# Patient Record
Sex: Male | Born: 1975 | Race: Black or African American | Hispanic: No | Marital: Single | Smoking: Current every day smoker
Health system: Southern US, Community
[De-identification: ages and names within clinical notes are randomized; demographics above are authoritative.]

## PROBLEM LIST (undated history)

## (undated) HISTORY — PX: ROTATOR CUFF REPAIR: SHX139

## (undated) HISTORY — PX: MENISCUS REPAIR: SHX5179

---

## 1998-11-21 ENCOUNTER — Emergency Department (HOSPITAL_COMMUNITY): Admission: EM | Admit: 1998-11-21 | Discharge: 1998-11-22 | Payer: Self-pay | Admitting: Emergency Medicine

## 2020-12-29 ENCOUNTER — Ambulatory Visit (HOSPITAL_COMMUNITY)
Admission: EM | Admit: 2020-12-29 | Discharge: 2020-12-29 | Disposition: A | Discharge status: Home / Self Care | Payer: Self-pay | Attending: Student | Admitting: Student

## 2020-12-29 ENCOUNTER — Ambulatory Visit (INDEPENDENT_AMBULATORY_CARE_PROVIDER_SITE_OTHER): Payer: Self-pay

## 2020-12-29 ENCOUNTER — Other Ambulatory Visit: Payer: Self-pay

## 2020-12-29 ENCOUNTER — Encounter (HOSPITAL_COMMUNITY): Payer: Self-pay | Admitting: *Deleted

## 2020-12-29 DIAGNOSIS — M62838 Other muscle spasm: Secondary | ICD-10-CM | POA: Insufficient documentation

## 2020-12-29 DIAGNOSIS — M542 Cervicalgia: Secondary | ICD-10-CM | POA: Insufficient documentation

## 2020-12-29 DIAGNOSIS — Z113 Encounter for screening for infections with a predominantly sexual mode of transmission: Secondary | ICD-10-CM | POA: Insufficient documentation

## 2020-12-29 DIAGNOSIS — Z202 Contact with and (suspected) exposure to infections with a predominantly sexual mode of transmission: Secondary | ICD-10-CM | POA: Insufficient documentation

## 2020-12-29 LAB — HIV ANTIBODY (ROUTINE TESTING W REFLEX): HIV Screen 4th Generation wRfx: NONREACTIVE

## 2020-12-29 LAB — HEPATITIS PANEL, ACUTE
HCV Ab: NONREACTIVE
Hep A IgM: NONREACTIVE
Hep B C IgM: NONREACTIVE
Hepatitis B Surface Ag: NONREACTIVE

## 2020-12-29 MED ORDER — NAPROXEN 500 MG PO TABS: 500.0000 mg | ORAL_TABLET | Freq: Two times a day (BID) | ORAL | 0 refills | Status: DC

## 2020-12-29 MED ORDER — TIZANIDINE HCL 4 MG PO CAPS: 4.0000 mg | ORAL_CAPSULE | Freq: Three times a day (TID) | ORAL | 0 refills | Status: DC

## 2020-12-29 NOTE — Discharge Instructions (Signed)
Your x-ray was normal.  I have called in Naprosyn which is like ibuprofen to help with your symptoms.  Take this twice a day as needed.  You should not take additional NSAIDs including aspirin, ibuprofen/Advil, naproxen/Aleve with this medication as it can cause stomach bleeding.  I have also called in a muscle relaxer known as tizanidine.  This can make you sleepy so do not drive or drink alcohol while taking this.  You can add 3 take 3 times a day but if you have things to do that day I recommend only taking it at night.  Use heat and stretch for additional symptom relief.  If your symptoms not improve you may need to consider MRI which need to be arranged with your PCP.  We will be in touch with your STI testing results soon as we have them if we need to arrange treatment.  If you develop any symptoms please return for reevaluation.

## 2020-12-29 NOTE — ED Notes (Signed)
No answer  when called in lobby 
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
## 2020-12-30 LAB — RPR: RPR Ser Ql: NONREACTIVE

## 2020-12-31 LAB — CYTOLOGY, (ORAL, ANAL, URETHRAL) ANCILLARY ONLY
Chlamydia: NEGATIVE
Comment: NEGATIVE
Comment: NEGATIVE
Comment: NORMAL
Neisseria Gonorrhea: NEGATIVE
Trichomonas: POSITIVE — AB

## 2021-01-01 ENCOUNTER — Emergency Department (HOSPITAL_COMMUNITY)
Admission: EM | Admit: 2021-01-01 | Discharge: 2021-01-01 | Disposition: A | Discharge status: Home / Self Care | Payer: Self-pay | Attending: Emergency Medicine | Admitting: Emergency Medicine

## 2021-01-01 ENCOUNTER — Telehealth (HOSPITAL_COMMUNITY): Payer: Self-pay | Admitting: Emergency Medicine

## 2021-01-01 DIAGNOSIS — M542 Cervicalgia: Secondary | ICD-10-CM | POA: Insufficient documentation

## 2021-01-01 DIAGNOSIS — F17200 Nicotine dependence, unspecified, uncomplicated: Secondary | ICD-10-CM | POA: Insufficient documentation

## 2021-01-01 MED ORDER — HYDROMORPHONE HCL 1 MG/ML IJ SOLN
1.0000 mg | Freq: Once | INTRAMUSCULAR | Status: AC
  Administered 2021-01-01: 1 mg via INTRAMUSCULAR
  Filled 2021-01-01: qty 1

## 2021-01-01 MED ORDER — PREDNISONE 20 MG PO TABS: 40.0000 mg | ORAL_TABLET | Freq: Every day | ORAL | 0 refills | Status: AC

## 2021-01-01 MED ORDER — METRONIDAZOLE 500 MG PO TABS: 500.0000 mg | ORAL_TABLET | Freq: Two times a day (BID) | ORAL | 0 refills | Status: DC

## 2021-01-01 MED ORDER — IBUPROFEN 800 MG PO TABS
800.0000 mg | ORAL_TABLET | Freq: Once | ORAL | Status: AC
  Administered 2021-01-01: 800 mg via ORAL
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
## 2021-01-01 NOTE — Discharge Instructions (Signed)
You have been seen here for neck pain, I will give you a prescription for steroids please take as prescribed, please beware that  this medication can increase your blood sugar, increase your blood pressure, increase your heart rate, patient all resolved 5 days after your last dose.  I recommend taking over-the-counter pain medications like ibuprofen and/or Tylenol every 6 as needed.  Please follow dosage and on the back of bottle.  I also recommend applying heat to the area and stretching out the muscles as this will help decrease stiffness and pain.  I have given you information on exercises please follow.  Please follow-up with community health and wellness for reevaluation given contact information above please call  Come back to the emergency department if you develop chest pain, shortness of breath, severe abdominal pain, uncontrolled nausea, vomiting, diarrhea.

## 2021-03-06 ENCOUNTER — Encounter (HOSPITAL_COMMUNITY): Payer: Self-pay | Admitting: Emergency Medicine

## 2021-03-06 ENCOUNTER — Other Ambulatory Visit: Payer: Self-pay

## 2021-03-06 ENCOUNTER — Ambulatory Visit (HOSPITAL_COMMUNITY)
Admission: EM | Admit: 2021-03-06 | Discharge: 2021-03-06 | Disposition: A | Discharge status: Home / Self Care | Payer: Self-pay

## 2021-03-06 ENCOUNTER — Emergency Department (HOSPITAL_COMMUNITY)
Admission: EM | Admit: 2021-03-06 | Discharge: 2021-03-06 | Disposition: A | Discharge status: Home / Self Care | Payer: Self-pay | Attending: Emergency Medicine | Admitting: Emergency Medicine

## 2021-03-06 ENCOUNTER — Encounter (HOSPITAL_COMMUNITY): Payer: Self-pay

## 2021-03-06 ENCOUNTER — Emergency Department (HOSPITAL_COMMUNITY): Payer: Self-pay

## 2021-03-06 DIAGNOSIS — M542 Cervicalgia: Secondary | ICD-10-CM | POA: Insufficient documentation

## 2021-03-06 DIAGNOSIS — F17200 Nicotine dependence, unspecified, uncomplicated: Secondary | ICD-10-CM | POA: Insufficient documentation

## 2021-03-06 DIAGNOSIS — Z202 Contact with and (suspected) exposure to infections with a predominantly sexual mode of transmission: Secondary | ICD-10-CM | POA: Insufficient documentation

## 2021-03-06 LAB — HIV ANTIBODY (ROUTINE TESTING W REFLEX): HIV Screen 4th Generation wRfx: NONREACTIVE

## 2021-03-06 MED ORDER — LIDOCAINE 5 % EX PTCH
1.0000 | MEDICATED_PATCH | CUTANEOUS | Status: DC
  Administered 2021-03-06: 1 via TRANSDERMAL
  Filled 2021-03-06: qty 1

## 2021-03-06 MED ORDER — METRONIDAZOLE 500 MG PO TABS
2000.0000 mg | ORAL_TABLET | Freq: Once | ORAL | Status: AC
  Administered 2021-03-06: 2000 mg via ORAL
  Filled 2021-03-06: qty 4

## 2021-03-06 MED ORDER — NAPROXEN 500 MG PO TABS: 500.0000 mg | ORAL_TABLET | Freq: Two times a day (BID) | ORAL | 0 refills | Status: DC

## 2021-03-06 MED ORDER — METHOCARBAMOL 500 MG PO TABS: 500.0000 mg | ORAL_TABLET | Freq: Two times a day (BID) | ORAL | 0 refills | Status: DC

## 2021-03-06 NOTE — Discharge Instructions (Addendum)
It was a pleasure taking care of you today. As discussed, your x-ray showed degenerative changes, but no broken bones. I am sending you home with symptomatic treatment.  Take as needed for pain.  Muscle relaxer can cause drowsiness so do not drive or operate machinery while on the medication.  You may also purchase over-the-counter Lidoderm patches and Voltaren gel for added pain relief.  Your STD test are pending.  Results should be available in the next few days. I have included the number of the neurosurgeon. Call to schedule an appointment for further evaluation of neck pain. Return to the ER for new or worsening symptoms.

## 2021-03-06 NOTE — ED Provider Notes (Signed)
[facility] EMERGENCY DEPARTMENT Provider Note   CSN: [id number] Arrival date & time: 03/06/21  1030     History Chief Complaint  Patient presents with   Exposure to STD   Neck Pain    Bill Greene is a 45 y.o. male with no significant past medical history who presents to the ED due to intermittent right-sided neck pain x2 months. No known injury. Admits to intermittent numbness/tingling around right side of neck.  Denies numbness/tingling to right upper extremity.  No upper extremity weakness.  He has been taking ibuprofen and using icy hot with moderate relief.  Patient was evaluated at urgent care prior to arrival and discussed with the provider that he was concerned about his artery, so sent to the ED for further evaluation. No headaches or blurry vision. No chest pain or shortness of breath.  Chart reviewed.  Patient was seen back in May 2022 for the same complaint which was thought to be related to MSK etiology.  Patient notes he never tried his muscle relaxers or pain medication.  Patient had an x-ray then which was negative for any acute abnormalities.  Patient states pain is worse when moving his neck.  No neck stiffness.  Denies fever and chills.  Patient is also requesting STD testing.  Denies penile discharge, testicular pain/edema, rash, dysuria, abdominal pain, pain with defection. He notes his partner tested positive for Trichomonas. No fever or chills.  History obtained from patient and past medical records. No interpreter used during encounter.       History reviewed. No pertinent past medical history.  There are no problems to display for this patient.   History reviewed. No pertinent surgical history.     Family History  Problem Relation Age of Onset   Healthy Mother     Social History   Tobacco Use   Smoking status: Every Day   Smokeless tobacco: Never  Substance Use Topics   Alcohol use: Not Currently   Drug use: Never    Home  Medications Prior to Admission medications   Medication Sig Start Date End Date Taking? Authorizing Provider  methocarbamol (ROBAXIN) 500 MG tablet Take 1 tablet (500 mg total) by mouth 2 (two) times daily. 03/06/21  Yes Aryn Safran C, PA-C  naproxen (NAPROSYN) 500 MG tablet Take 1 tablet (500 mg total) by mouth 2 (two) times daily. 03/06/21  Yes Jayr Lupercio, Merla Riches, PA-C  metroNIDAZOLE (FLAGYL) 500 MG tablet Take 1 tablet (500 mg total) by mouth 2 (two) times daily. 01/01/21   Lamptey, Britta Mccreedy, MD  naproxen (NAPROSYN) 500 MG tablet Take 1 tablet (500 mg total) by mouth 2 (two) times daily. 12/29/20   Raspet, Noberto Retort, PA-C  tiZANidine (ZANAFLEX) 4 MG capsule Take 1 capsule (4 mg total) by mouth 3 (three) times daily. 12/29/20   Raspet, Noberto Retort, PA-C    Allergies    Patient has no known allergies.  Review of Systems   Review of Systems  Constitutional:  Negative for chills and fever.  Respiratory:  Negative for shortness of breath.   Cardiovascular:  Negative for chest pain.  Gastrointestinal:  Negative for abdominal pain.  Genitourinary:  Negative for dysuria, hematuria, penile discharge, penile pain and testicular pain.  Musculoskeletal:  Positive for neck pain. Negative for neck stiffness.  Neurological:  Positive for numbness. Negative for weakness.  All other systems reviewed and are negative.  Physical Exam Updated Vital Signs BP (!) 144/81 (BP Location: Left Arm)  Pulse 84   Temp 98.7 F (37.1 C) (Oral)   Resp 16   SpO2 96%   Physical Exam Vitals and nursing note reviewed.  Constitutional:      General: He is not in acute distress.    Appearance: He is not ill-appearing.  HENT:     Head: Normocephalic.  Eyes:     Pupils: Pupils are equal, round, and reactive to light.  Neck:     Comments: No cervical midline tenderness.  Full range of motion of neck.  Reproducible right-sided trapezius muscle tenderness.  Cardiovascular:     Rate and Rhythm: Normal rate and regular  rhythm.     Pulses: Normal pulses.     Heart sounds: Normal heart sounds. No murmur heard.   No friction rub. No gallop.  Pulmonary:     Effort: Pulmonary effort is normal.     Breath sounds: Normal breath sounds.  Abdominal:     General: Abdomen is flat. There is no distension.     Palpations: Abdomen is soft.     Tenderness: There is no abdominal tenderness. There is no guarding or rebound.  Musculoskeletal:        General: Normal range of motion.     Cervical back: Neck supple.     Comments: Equal grip strength.  No thoracic or lumbar midline tenderness.  Skin:    General: Skin is warm and dry.  Neurological:     General: No focal deficit present.     Mental Status: He is alert.  Psychiatric:        Mood and Affect: Mood normal.        Behavior: Behavior normal.    ED Results / Procedures / Treatments   Labs (all labs ordered are listed, but only abnormal results are displayed) Labs Reviewed  HIV ANTIBODY (ROUTINE TESTING W REFLEX)  RPR  GC/CHLAMYDIA PROBE AMP ([place]) NOT AT [facility]    EKG None  Radiology DG Cervical Spine Complete  Result Date: 03/06/2021 CLINICAL DATA:  Neck pain radiating to right side of head. EXAM: CERVICAL SPINE - COMPLETE 4+ VIEW COMPARISON:  12/29/2020 FINDINGS: Normal alignment. Diffuse degenerative disc disease with disc space narrowing and spurring. Diffuse bilateral degenerative facet disease. Bilateral neural foraminal narrowing from C4-5 through C6-7 due to uncovertebral spurring and facet disease. No fracture. IMPRESSION: Degenerative disc and facet disease as above. No acute bony abnormality. Electronically Signed   By: Charlett Nose M.D.   On: 03/06/2021 11:44    Procedures Procedures   Medications Ordered in ED Medications  lidocaine (LIDODERM) 5 % 1 patch (has no administration in time range)  metroNIDAZOLE (FLAGYL) tablet 2,000 mg (has no administration in time range)    ED Course  I have reviewed the triage vital signs and  the nursing notes.  Pertinent labs & imaging results that were available during my care of the patient were reviewed by me and considered in my medical decision making (see chart for details).    MDM Rules/Calculators/A&P                         {Remember to document critical care time when appropriate: 45 year old male presents to the ED due to right-sided neck pain x2 months.  He is also requesting STD testing.  Had a recent trichomonas exposure.  No penile symptoms.  No fever or chills. No neck injury.  Upon arrival, stable vitals.  Patient is afebrile, not tachycardic or hypoxic.  Patient nontoxic-appearing.  Physical exam reassuring.  No cervical, thoracic, or lumbar midline tenderness.  Equal grip strength.  Upper extremities neurovascularly intact.  Low suspicion for central cord compression.  Reproducible tenderness over right trapezius muscle. Suspect neck pain related to muscular etiology. C-spine x-ray negative for any bony fractures.  It did demonstrate degenerative changes. STD testing pending. Patient treated for trichomonas due to exposure here in the ED. Neurosurgery number given to patient at discharge and advised to call to schedule an appointment for further evaluation of neck due to degenerative changes on x-ray. Patient discharged with symptomatic treatment. Strict ED precautions discussed with patient. Patient states understanding and agrees to plan. Patient discharged home in no acute distress and stable vitals  Final Clinical Impression(s) / ED Diagnoses Final diagnoses:  Exposure to STD  Neck pain    Rx / DC Orders ED Discharge Orders          Ordered    methocarbamol (ROBAXIN) 500 MG tablet  2 times daily        03/06/21 1529    naproxen (NAPROSYN) 500 MG tablet  2 times daily        03/06/21 1529             Jesusita Oka 03/06/21 1533    Long, Arlyss Repress, MD 03/11/21 1303

## 2021-03-06 NOTE — ED Triage Notes (Signed)
Pt requests STD testing, denies any symptoms. Also having right sided neck pain and popping. Denies CP.

## 2021-03-06 NOTE — ED Notes (Signed)
Pt stepped outside.  

## 2021-03-06 NOTE — ED Notes (Signed)
Patient is being discharged from the Urgent Care and sent to the Emergency Department via pov . Per Chales Salmon, NP, patient is in need of higher level of care due to Severe neck pain. Patient states that his "artery is blocked" Patient is aware and verbalizes understanding of plan of care.  Vitals:   03/06/21 1006  BP: 140/82  Pulse: 98  Resp: 20  Temp: 98.2 F (36.8 C)  SpO2: 97%

## 2021-03-06 NOTE — ED Triage Notes (Signed)
Pt in with c/o right side neck pain that has been going on for a while. States his neck is constantly popping and it feels like a burning sensation.  Pt states "it feels like the blood is not flowing through my artery"   Pt also requesting std testing  States he was told by someone that it could possibly be trichomonas, but then the person tested negative for trich.  Pt denies having any penile sx at the moment.

## 2021-03-06 NOTE — ED Provider Notes (Signed)
Emergency Medicine Provider Triage Evaluation Note  Bill Greene , a 45 y.o. male  was evaluated in triage.  Pt complains of neck pain and concern for STD.  Recently admitted with reports of productive department for follow-up.  He is not having any symptoms such as penile discharge, fevers, rashes.  No dysuria or hematuria.  Concerned about his neck, this has been constant, localized with movement.  Present for the last 2 months he denies any trauma..  Review of Systems  Positive: NECK PAIN, STD Negative: Above  Physical Exam  BP (!) 144/81 (BP Location: Left Arm)   Pulse 84   Temp 98.7 F (37.1 C) (Oral)   Resp 16   SpO2 96%  Gen:   Awake, no distress   Resp:  Normal effort  MSK:   Moves extremities without difficulty  Other:  No midline tenderness.  ROM ntact  Medical Decision Making  Medically screening exam initiated at 10:58 AM.  Appropriate orders placed.  [name] was informed that the remainder of the evaluation will be completed by another provider, this initial triage assessment does not replace that evaluation, and the importance of remaining in the ED until their evaluation is complete.     [name], PA-C 03/06/21 1059    [name], MD 03/07/21 1740

## 2021-03-06 NOTE — Progress Notes (Signed)
   03/06/21 1656  Clinical Encounter Type  Visited With Patient not available  Visit Type Trauma  Referral From Nurse  Consult/Referral To Chaplain    Chaplain remains available if needed. This note was prepared by Deneen Harts, M.Div..  For questions please contact by phone [phone].

## 2021-03-06 NOTE — ED Provider Notes (Signed)
[facility]    CSN: [id number] Arrival date & time: 03/06/21  2706      History   Chief Complaint Chief Complaint  Patient presents with   Neck Pain   std testing    HPI Bill Greene is a 45 y.o. male.   Patient here for evaluation of right sided neck pain that has been worsening over the past month.  Patient was seen and evaluated for the same in May.  At that time he was told it was likely muscular in nature and given muscle relaxers and steroids.  Reports not taking medications because he felt like it would improve.  Reports that he feels like "his artery is blocked" and that he thinks it is related to "his heart."  Reports taking ibuprofen which helps improve symptoms temporarily.  Also concerned about possible trich exposure.   The history is provided by the patient.  Neck Pain  History reviewed. No pertinent past medical history.  There are no problems to display for this patient.   History reviewed. No pertinent surgical history.     Home Medications    Prior to Admission medications   Medication Sig Start Date End Date Taking? Authorizing Provider  metroNIDAZOLE (FLAGYL) 500 MG tablet Take 1 tablet (500 mg total) by mouth 2 (two) times daily. 01/01/21   Lamptey, Britta Mccreedy, MD  naproxen (NAPROSYN) 500 MG tablet Take 1 tablet (500 mg total) by mouth 2 (two) times daily. 12/29/20   Raspet, Noberto Retort, PA-C  tiZANidine (ZANAFLEX) 4 MG capsule Take 1 capsule (4 mg total) by mouth 3 (three) times daily. 12/29/20   Raspet, Noberto Retort, PA-C    Family History Family History  Problem Relation Age of Onset   Healthy Mother     Social History Social History   Tobacco Use   Smoking status: Every Day   Smokeless tobacco: Never  Substance Use Topics   Alcohol use: Not Currently   Drug use: Never     Allergies   Patient has no known allergies.   Review of Systems Review of Systems  Genitourinary:  Negative for penile discharge, penile pain, penile swelling  and testicular pain.  Musculoskeletal:  Positive for neck pain.  All other systems reviewed and are negative.   Physical Exam Triage Vital Signs ED Triage Vitals  Enc Vitals Group     BP 03/06/21 1006 140/82     Pulse Rate 03/06/21 1006 98     Resp 03/06/21 1006 20     Temp 03/06/21 1006 98.2 F (36.8 C)     Temp Source 03/06/21 1006 Oral     SpO2 03/06/21 1006 97 %     Weight --      Height --      Head Circumference --      Peak Flow --      Pain Score 03/06/21 1007 6     Pain Loc --      Pain Edu? --      Excl. in GC? --    No data found.  Updated Vital Signs BP 140/82 (BP Location: Left Arm)   Pulse 98   Temp 98.2 F (36.8 C) (Oral)   Resp 20   SpO2 97%   Visual Acuity Right Eye Distance:   Left Eye Distance:   Bilateral Distance:    Right Eye Near:   Left Eye Near:    Bilateral Near:     Physical Exam Vitals and nursing note reviewed.  Constitutional:      General: He is not in acute distress.    Appearance: Normal appearance. He is not ill-appearing, toxic-appearing or diaphoretic.  HENT:     Head: Normocephalic and atraumatic.  Eyes:     Conjunctiva/sclera: Conjunctivae normal.  Cardiovascular:     Rate and Rhythm: Normal rate.     Pulses: Normal pulses.  Pulmonary:     Effort: Pulmonary effort is normal.  Abdominal:     General: Abdomen is flat.  Musculoskeletal:        General: Normal range of motion.     Left shoulder: Normal range of motion.     Cervical back: Normal range of motion. No bony tenderness.     Thoracic back: Normal.     Lumbar back: Normal.  Skin:    General: Skin is warm and dry.  Neurological:     General: No focal deficit present.     Mental Status: He is alert and oriented to person, place, and time.  Psychiatric:        Mood and Affect: Mood normal.     UC Treatments / Results  Labs (all labs ordered are listed, but only abnormal results are displayed) Labs Reviewed - No data to  display  EKG   Radiology No results found.  Procedures Procedures (including critical care time)  Medications Ordered in UC Medications - No data to display  Initial Impression / Assessment and Plan / UC Course  I have reviewed the triage vital signs and the nursing notes.  Pertinent labs & imaging results that were available during my care of the patient were reviewed by me and considered in my medical decision making (see chart for details).    Offered to re-prescribe steriods to help with neck pain but patient declines. He reports that he thinks this is related to his artery and that he is concerned he might have a stroke.  It was explained that if patient was that concerned, he can go to the ED for further evaluation as we are not able to perform any further evaluation here.  Final Clinical Impressions(s) / UC Diagnoses   Final diagnoses:  Neck pain   Discharge Instructions   None    ED Prescriptions   None    PDMP not reviewed this encounter.   Ivette Loyal, NP 03/06/21 1031

## 2021-03-07 LAB — RPR: RPR Ser Ql: NONREACTIVE

## 2021-08-13 ENCOUNTER — Emergency Department (HOSPITAL_COMMUNITY)
Admission: EM | Admit: 2021-08-13 | Discharge: 2021-08-14 | Disposition: A | Discharge status: Other Acute Inpatient Hospital | Payer: Self-pay | Attending: Student | Admitting: Student

## 2021-08-13 ENCOUNTER — Emergency Department (HOSPITAL_COMMUNITY): Payer: Self-pay

## 2021-08-13 ENCOUNTER — Other Ambulatory Visit: Payer: Self-pay

## 2021-08-13 ENCOUNTER — Encounter (HOSPITAL_COMMUNITY): Payer: Self-pay

## 2021-08-13 DIAGNOSIS — Z20822 Contact with and (suspected) exposure to covid-19: Secondary | ICD-10-CM | POA: Insufficient documentation

## 2021-08-13 DIAGNOSIS — Y92410 Unspecified street and highway as the place of occurrence of the external cause: Secondary | ICD-10-CM | POA: Insufficient documentation

## 2021-08-13 DIAGNOSIS — R45850 Homicidal ideations: Secondary | ICD-10-CM | POA: Insufficient documentation

## 2021-08-13 DIAGNOSIS — D72829 Elevated white blood cell count, unspecified: Secondary | ICD-10-CM | POA: Insufficient documentation

## 2021-08-13 DIAGNOSIS — S0093XA Contusion of unspecified part of head, initial encounter: Secondary | ICD-10-CM | POA: Insufficient documentation

## 2021-08-13 DIAGNOSIS — Z046 Encounter for general psychiatric examination, requested by authority: Secondary | ICD-10-CM | POA: Insufficient documentation

## 2021-08-13 DIAGNOSIS — M542 Cervicalgia: Secondary | ICD-10-CM | POA: Insufficient documentation

## 2021-08-13 DIAGNOSIS — F1910 Other psychoactive substance abuse, uncomplicated: Secondary | ICD-10-CM

## 2021-08-13 DIAGNOSIS — R45851 Suicidal ideations: Secondary | ICD-10-CM | POA: Insufficient documentation

## 2021-08-13 DIAGNOSIS — F1994 Other psychoactive substance use, unspecified with psychoactive substance-induced mood disorder: Secondary | ICD-10-CM | POA: Insufficient documentation

## 2021-08-13 LAB — CBC WITH DIFFERENTIAL/PLATELET
Abs Immature Granulocytes: 0.03 10*3/uL (ref 0.00–0.07)
Basophils Absolute: 0.1 10*3/uL (ref 0.0–0.1)
Basophils Relative: 1 %
Eosinophils Absolute: 0.1 10*3/uL (ref 0.0–0.5)
Eosinophils Relative: 1 %
HCT: 47.8 % (ref 39.0–52.0)
Hemoglobin: 15.5 g/dL (ref 13.0–17.0)
Immature Granulocytes: 0 %
Lymphocytes Relative: 15 %
Lymphs Abs: 1.7 10*3/uL (ref 0.7–4.0)
MCH: 31.4 pg (ref 26.0–34.0)
MCHC: 32.4 g/dL (ref 30.0–36.0)
MCV: 97 fL (ref 80.0–100.0)
Monocytes Absolute: 0.7 10*3/uL (ref 0.1–1.0)
Monocytes Relative: 7 %
Neutro Abs: 8.4 10*3/uL — ABNORMAL HIGH (ref 1.7–7.7)
Neutrophils Relative %: 76 %
Platelets: 232 10*3/uL (ref 150–400)
RBC: 4.93 MIL/uL (ref 4.22–5.81)
RDW: 12.9 % (ref 11.5–15.5)
WBC: 10.9 10*3/uL — ABNORMAL HIGH (ref 4.0–10.5)
nRBC: 0 % (ref 0.0–0.2)

## 2021-08-13 LAB — RAPID URINE DRUG SCREEN, HOSP PERFORMED
Amphetamines: NOT DETECTED
Barbiturates: NOT DETECTED
Benzodiazepines: NOT DETECTED
Cocaine: POSITIVE — AB
Opiates: NOT DETECTED
Tetrahydrocannabinol: POSITIVE — AB

## 2021-08-13 LAB — COMPREHENSIVE METABOLIC PANEL
ALT: 40 U/L (ref 0–44)
AST: 51 U/L — ABNORMAL HIGH (ref 15–41)
Albumin: 4.3 g/dL (ref 3.5–5.0)
Alkaline Phosphatase: 72 U/L (ref 38–126)
Anion gap: 9 (ref 5–15)
BUN: 14 mg/dL (ref 6–20)
CO2: 25 mmol/L (ref 22–32)
Calcium: 9.4 mg/dL (ref 8.9–10.3)
Chloride: 101 mmol/L (ref 98–111)
Creatinine, Ser: 1.43 mg/dL — ABNORMAL HIGH (ref 0.61–1.24)
GFR, Estimated: >60 mL/min (ref >60)
Glucose, Bld: 178 mg/dL — ABNORMAL HIGH (ref 70–99)
Potassium: 4 mmol/L (ref 3.5–5.1)
Sodium: 135 mmol/L (ref 135–145)
Total Bilirubin: 1.7 mg/dL — ABNORMAL HIGH (ref 0.3–1.2)
Total Protein: 7.4 g/dL (ref 6.5–8.1)

## 2021-08-13 LAB — ETHANOL: Alcohol, Ethyl (B): <10 mg/dL (ref <10)

## 2021-08-13 LAB — SALICYLATE LEVEL: Salicylate Lvl: <7 mg/dL — ABNORMAL LOW (ref 7.0–30.0)

## 2021-08-13 LAB — ACETAMINOPHEN LEVEL: Acetaminophen (Tylenol), Serum: <10 ug/mL — ABNORMAL LOW (ref 10–30)

## 2021-08-13 MED ORDER — LORAZEPAM 2 MG/ML IJ SOLN: 0.0000 mg | Freq: Four times a day (QID) | INTRAMUSCULAR | Status: DC

## 2021-08-13 MED ORDER — LORAZEPAM 1 MG PO TABS: 0.0000 mg | ORAL_TABLET | Freq: Two times a day (BID) | ORAL | Status: DC

## 2021-08-13 MED ORDER — THIAMINE HCL 100 MG PO TABS
100.0000 mg | ORAL_TABLET | Freq: Every day | ORAL | Status: DC
  Administered 2021-08-13: 100 mg via ORAL
  Filled 2021-08-13: qty 1

## 2021-08-13 MED ORDER — LACTATED RINGERS IV BOLUS
1000.0000 mL | Freq: Once | INTRAVENOUS | Status: AC
  Administered 2021-08-13: 1000 mL via INTRAVENOUS

## 2021-08-13 MED ORDER — THIAMINE HCL 100 MG/ML IJ SOLN: 100.0000 mg | Freq: Every day | INTRAMUSCULAR | Status: DC

## 2021-08-13 MED ORDER — LORAZEPAM 1 MG PO TABS
0.0000 mg | ORAL_TABLET | Freq: Four times a day (QID) | ORAL | Status: DC
  Administered 2021-08-13: 1 mg via ORAL
  Administered 2021-08-13: 2 mg via ORAL
  Filled 2021-08-13: qty 2
  Filled 2021-08-13: qty 1

## 2021-08-13 MED ORDER — LORAZEPAM 2 MG/ML IJ SOLN: 0.0000 mg | Freq: Two times a day (BID) | INTRAMUSCULAR | Status: DC
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
## 2021-08-13 NOTE — ED Triage Notes (Signed)
Reports he need detox from alcohol and cocaine.  Reports he drinks a 5th a day.  Last drink was this am 40oz beer. Last use of cocaine was last pm

## 2021-08-13 NOTE — ED Notes (Signed)
Pt back to hallway bed after TTS complete. Pt given sandwich bag and soda per request. No acute changes noted. Will continue to monitor.
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
## 2021-08-13 NOTE — ED Notes (Signed)
Patient transported to CT 

## 2021-08-13 NOTE — ED Notes (Signed)
Pt belongings placed in locker #6 in purple zone

## 2021-08-13 NOTE — ED Notes (Signed)
Pt given sandwich bag and soda per request. Pt denies any complaints at this time. No acute changes noted. Will continue to monitor.

## 2021-08-13 NOTE — ED Notes (Signed)
Pt ambulatory to room 43 to do TTS, pt given blanket. Talking with TTS at this time.

## 2021-08-14 ENCOUNTER — Encounter (HOSPITAL_COMMUNITY): Payer: Self-pay | Admitting: Registered Nurse

## 2021-08-14 ENCOUNTER — Other Ambulatory Visit (HOSPITAL_COMMUNITY)
Admission: EM | Admit: 2021-08-14 | Discharge: 2021-08-15 | Disposition: A | Discharge status: Home / Self Care | Payer: No Payment, Other | Attending: Psychiatry | Admitting: Psychiatry

## 2021-08-14 DIAGNOSIS — F1994 Other psychoactive substance use, unspecified with psychoactive substance-induced mood disorder: Secondary | ICD-10-CM

## 2021-08-14 DIAGNOSIS — F1494 Cocaine use, unspecified with cocaine-induced mood disorder: Secondary | ICD-10-CM | POA: Diagnosis not present

## 2021-08-14 DIAGNOSIS — F1090 Alcohol use, unspecified, uncomplicated: Secondary | ICD-10-CM

## 2021-08-14 DIAGNOSIS — Z5900 Homelessness unspecified: Secondary | ICD-10-CM | POA: Diagnosis not present

## 2021-08-14 DIAGNOSIS — F1210 Cannabis abuse, uncomplicated: Secondary | ICD-10-CM | POA: Insufficient documentation

## 2021-08-14 DIAGNOSIS — Z79899 Other long term (current) drug therapy: Secondary | ICD-10-CM | POA: Insufficient documentation

## 2021-08-14 DIAGNOSIS — Z9151 Personal history of suicidal behavior: Secondary | ICD-10-CM | POA: Insufficient documentation

## 2021-08-14 DIAGNOSIS — Z789 Other specified health status: Secondary | ICD-10-CM

## 2021-08-14 DIAGNOSIS — F14959 Cocaine use, unspecified with cocaine-induced psychotic disorder, unspecified: Secondary | ICD-10-CM

## 2021-08-14 DIAGNOSIS — F1910 Other psychoactive substance abuse, uncomplicated: Secondary | ICD-10-CM

## 2021-08-14 DIAGNOSIS — F1410 Cocaine abuse, uncomplicated: Secondary | ICD-10-CM

## 2021-08-14 DIAGNOSIS — R45851 Suicidal ideations: Secondary | ICD-10-CM

## 2021-08-14 DIAGNOSIS — F1290 Cannabis use, unspecified, uncomplicated: Secondary | ICD-10-CM

## 2021-08-14 LAB — LIPID PANEL
Cholesterol: 214 mg/dL — ABNORMAL HIGH (ref 0–200)
HDL: 73 mg/dL (ref >40)
LDL Cholesterol: 117 mg/dL — ABNORMAL HIGH (ref 0–99)
Total CHOL/HDL Ratio: 2.9 RATIO
Triglycerides: 122 mg/dL (ref <150)
VLDL: 24 mg/dL (ref 0–40)

## 2021-08-14 LAB — TSH: TSH: 0.706 u[IU]/mL (ref 0.350–4.500)

## 2021-08-14 LAB — RESP PANEL BY RT-PCR (FLU A&B, COVID) ARPGX2
Influenza A by PCR: NEGATIVE
Influenza B by PCR: NEGATIVE
SARS Coronavirus 2 by RT PCR: NEGATIVE

## 2021-08-14 LAB — HEMOGLOBIN A1C
Hgb A1c MFr Bld: 5.2 % (ref 4.8–5.6)
Mean Plasma Glucose: 102.54 mg/dL

## 2021-08-14 MED ORDER — LOPERAMIDE HCL 2 MG PO CAPS: 2.0000 mg | ORAL_CAPSULE | ORAL | Status: DC | PRN

## 2021-08-14 MED ORDER — LORAZEPAM 1 MG PO TABS
1.0000 mg | ORAL_TABLET | Freq: Four times a day (QID) | ORAL | Status: DC | PRN
  Administered 2021-08-14: 1 mg via ORAL

## 2021-08-14 MED ORDER — ADULT MULTIVITAMIN W/MINERALS CH
1.0000 | ORAL_TABLET | Freq: Every day | ORAL | Status: DC
  Administered 2021-08-14 – 2021-08-15 (×2): 1 via ORAL
  Filled 2021-08-14 (×2): qty 1

## 2021-08-14 MED ORDER — OLANZAPINE 5 MG PO TABS
5.0000 mg | ORAL_TABLET | Freq: Two times a day (BID) | ORAL | Status: DC
  Administered 2021-08-14 – 2021-08-15 (×3): 5 mg via ORAL
  Filled 2021-08-14 (×3): qty 1

## 2021-08-14 MED ORDER — ONDANSETRON 4 MG PO TBDP
4.0000 mg | ORAL_TABLET | Freq: Four times a day (QID) | ORAL | Status: DC | PRN
  Administered 2021-08-15: 4 mg via ORAL

## 2021-08-14 MED ORDER — HYDROXYZINE HCL 25 MG PO TABS
25.0000 mg | ORAL_TABLET | Freq: Three times a day (TID) | ORAL | Status: DC | PRN
  Administered 2021-08-14: 25 mg via ORAL

## 2021-08-14 MED ORDER — ALUM & MAG HYDROXIDE-SIMETH 200-200-20 MG/5ML PO SUSP: 30.0000 mL | ORAL | Status: DC | PRN

## 2021-08-14 MED ORDER — NICOTINE 21 MG/24HR TD PT24
21.0000 mg | MEDICATED_PATCH | Freq: Once | TRANSDERMAL | Status: DC
  Filled 2021-08-14: qty 1

## 2021-08-14 MED ORDER — GABAPENTIN 100 MG PO CAPS
100.0000 mg | ORAL_CAPSULE | Freq: Three times a day (TID) | ORAL | Status: DC
  Administered 2021-08-14 – 2021-08-15 (×4): 100 mg via ORAL
  Filled 2021-08-14 (×4): qty 1

## 2021-08-14 MED ORDER — MAGNESIUM HYDROXIDE 400 MG/5ML PO SUSP: 30.0000 mL | Freq: Every day | ORAL | Status: DC | PRN

## 2021-08-14 MED ORDER — ACETAMINOPHEN 325 MG PO TABS: 650.0000 mg | ORAL_TABLET | Freq: Four times a day (QID) | ORAL | Status: DC | PRN

## 2021-08-14 MED ORDER — TRAZODONE HCL 50 MG PO TABS
50.0000 mg | ORAL_TABLET | Freq: Every evening | ORAL | Status: DC | PRN
  Administered 2021-08-14 (×2): 50 mg via ORAL
  Filled 2021-08-14: qty 1

## 2021-08-14 MED ORDER — THIAMINE HCL 100 MG PO TABS
100.0000 mg | ORAL_TABLET | Freq: Every day | ORAL | Status: DC
  Administered 2021-08-15: 100 mg via ORAL
  Filled 2021-08-14: qty 1

## 2021-08-14 NOTE — Progress Notes (Signed)
Per provider, Shuvon Rankin, patient recommended for residential treatment.  CSW provided residential treatment options for patient to get follow up with after discharge.     Praise Dolecki, LCSW, LCAS Clincal Social Worker  [facility]

## 2021-08-14 NOTE — Progress Notes (Signed)
Bill Greene denied all of the symptoms related to alcohol withdrawal. He was medicated per order and requested Trazodone for sleep.

## 2021-08-14 NOTE — Progress Notes (Signed)
Patient was started on zyprexa 5mg  bid and received his first dose.  He remains oddly related and somewhat disorganized in thought.  He has been sleeping throughout the day except for meals.  Will continue to monitor and provide safe environment.

## 2021-08-14 NOTE — ED Notes (Signed)
Patient admitted to [facility] from [facility].  He was calm and cooperative however somewhat sedated requiring writer to coax him awake at times.  He was somewhat disorganized in behavior.  He completed paperwork and was shown unit and to his room.  Bed made for him and he asked for food so he was brought to lunch area and given a hot meal.  Patient also given ordered medication.  Patient reported auditory hallucinations in the form of voices asking "how long are we going to be here?"  Patient was focused on sleep and was shown to his room and is now resting comfortably in bed.  He denied shi or plan Denied visual hallucinations.  Will monitor and maintain safe environment.  Will also attempt to anticipate needs while patient is detoxing form substances.

## 2021-08-14 NOTE — ED Notes (Signed)
Pt things sent with safe transport

## 2021-08-14 NOTE — Progress Notes (Signed)
Patient received ativan 1 mg due to anxiety, visible sweat, and CIWA of 7. Patient asked RN how long could he stay at the [facility]. RN explained the OBS unit is a 24 hour unit, and patients are reassessed by a provider and a discharge plan is made. OBS unit is not a long term unit or inpatient unit. Nursing staff will continue to monitor.

## 2021-08-14 NOTE — ED Notes (Signed)
[facility] contacted for report, [facility] RN stated that they were unaware that her would be coming to their facility, Clayborn Bigness and psychiatric team notified

## 2021-08-14 NOTE — Progress Notes (Signed)
Report received from [facility] ED RN Mel Almond.  Patient to be transported when [employer] arrives.

## 2021-08-14 NOTE — Progress Notes (Signed)
ED RN contacted [facility] instead of [facility] for report.
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
## 2021-08-14 NOTE — Progress Notes (Addendum)
Patient awake asking for sleeping medication. Staff informed patient sleeping medication not given at this time. Patients speech is hard to understand at times speaks in a low tone.Patient requested food, given breakfast. Patient states he is feeling anxious. Patient seeking medications early this morning. No tremors seen, no vomiting or nausea. Patient asking RN what kind of car staff drives, RN informed patient that information is not available.

## 2021-08-14 NOTE — ED Notes (Signed)
Pt sleeping at present, no distress noted.  Monitoring for safety. 
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
## 2021-08-14 NOTE — Clinical Social Work Psych Note (Incomplete)
CSW Initial Note

## 2021-08-14 NOTE — Progress Notes (Signed)
Received Bill Greene at the change of shift asleep in his bed. He was awaken  and attended the AA meeting. Afterwards he received a snack per his choice.

## 2021-08-14 NOTE — Progress Notes (Signed)
Patient awake and out of bed asking for lunch which he was given.  Patient is oddly related and reports hearing voices telling him he "is hungry."  It is difficult to communicate with him as he does not acknowledge that he has heard what has been said.  Patient is somewhat irritable and poorly related.  He will often have his eyes closed when speaking with Clinical research associate and it appears that he is not oriented however he was able to identify the day, his name and who the president was.  He was not certain where he was and Clinical research associate reoriented him to this information.  Patient asked how long he would be here.  Writer informed him it would be a few days until he clears and is stable enough to transfer to another facility or be discharged.  Will monitor and provide safe environment.

## 2021-08-14 NOTE — Progress Notes (Signed)
Dinner given
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
## 2021-08-15 DIAGNOSIS — F1494 Cocaine use, unspecified with cocaine-induced mood disorder: Secondary | ICD-10-CM | POA: Diagnosis not present

## 2021-08-15 DIAGNOSIS — F1210 Cannabis abuse, uncomplicated: Secondary | ICD-10-CM | POA: Diagnosis not present

## 2021-08-15 DIAGNOSIS — F1994 Other psychoactive substance use, unspecified with psychoactive substance-induced mood disorder: Secondary | ICD-10-CM | POA: Diagnosis not present

## 2021-08-15 DIAGNOSIS — Z5900 Homelessness unspecified: Secondary | ICD-10-CM | POA: Diagnosis not present

## 2021-08-15 MED ORDER — OLANZAPINE 5 MG PO TABS: 5.0000 mg | ORAL_TABLET | Freq: Two times a day (BID) | ORAL | 0 refills | Status: DC

## 2021-08-15 MED ORDER — GABAPENTIN 100 MG PO CAPS: 100.0000 mg | ORAL_CAPSULE | Freq: Three times a day (TID) | ORAL | 0 refills | Status: DC

## 2021-08-15 NOTE — Progress Notes (Signed)
Patient asked for discharge.  He was somewhat intense and compared unit to prison.  He was reassured that we are a voluntary unit.  Dr Bronwen Betters and Baldo Daub made aware and came to speak with him.  He could not give a definite reason for wanting to leave however writer had heard him on the phone to his bank shortly before he verbalized his desire to discharge.  Patient was calm during the discharge process.  He was given avs with resources, paper prescriptions, and a bus pass to help him get to where he needed to go.  Patient given all of his belongings out of locker.  He was walked to the front door and was seen walking down the street to where the bus was.
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
## 2021-08-15 NOTE — Progress Notes (Signed)
Bill Greene woke up at 0600 hours,he asked for soap and towels. He was given the necessary items and clean scrubs. He showered. He stated sleeping, but did not sleep well.

## 2021-08-15 NOTE — Discharge Instructions (Signed)

## 2021-08-16 ENCOUNTER — Emergency Department (HOSPITAL_COMMUNITY)
Admission: EM | Admit: 2021-08-16 | Discharge: 2021-08-16 | Disposition: A | Discharge status: Home / Self Care | Payer: Self-pay | Attending: Student | Admitting: Student

## 2021-08-16 ENCOUNTER — Ambulatory Visit (HOSPITAL_COMMUNITY)
Admission: EM | Admit: 2021-08-16 | Discharge: 2021-08-16 | Disposition: A | Discharge status: Home / Self Care | Payer: No Payment, Other | Attending: Psychiatry | Admitting: Psychiatry

## 2021-08-16 ENCOUNTER — Other Ambulatory Visit: Payer: Self-pay

## 2021-08-16 DIAGNOSIS — F1090 Alcohol use, unspecified, uncomplicated: Secondary | ICD-10-CM

## 2021-08-16 DIAGNOSIS — F1010 Alcohol abuse, uncomplicated: Secondary | ICD-10-CM | POA: Insufficient documentation

## 2021-08-16 DIAGNOSIS — F1490 Cocaine use, unspecified, uncomplicated: Secondary | ICD-10-CM

## 2021-08-16 DIAGNOSIS — Z5900 Homelessness unspecified: Secondary | ICD-10-CM | POA: Insufficient documentation

## 2021-08-16 DIAGNOSIS — F1410 Cocaine abuse, uncomplicated: Secondary | ICD-10-CM | POA: Insufficient documentation

## 2021-08-16 DIAGNOSIS — Z789 Other specified health status: Secondary | ICD-10-CM

## 2021-08-16 NOTE — ED Provider Notes (Signed)
[facility] EMERGENCY DEPARTMENT Provider Note   CSN: [id number] Arrival date & time: 08/16/21  0217     History  No chief complaint on file.   Bill Greene is a 46 y.o. male with pertinent history of cocaine use, cocaine induced psychotic disorder, marijuana use, alcohol use, self-reported bipolar schizophrenia, homelessness.  Presents to the emergency department requesting detox from alcohol and crack cocaine.  Patient states that he last used alcohol and crack cocaine approximately 4 hours ago.  Patient states that he had 3 beers today.  Patient states that he uses "a lot," of crack cocaine today.  Patient endorses auditory hallucinations.  Is unable to fully describe what these hallucinations are.   Additionally patient complains of pain to bilateral trapezius muscles.  Patient denies any SI, HI, visual hallucinations, chest pain, shortness of breath.    HPI     Home Medications Prior to Admission medications   Medication Sig Start Date End Date Taking? Authorizing Provider  gabapentin (NEURONTIN) 100 MG capsule Take 1 capsule (100 mg total) by mouth 3 (three) times daily. 08/15/21 09/14/21  Estella Husk, MD  OLANZapine (ZYPREXA) 5 MG tablet Take 1 tablet (5 mg total) by mouth 2 (two) times daily. 08/15/21 09/14/21  Estella Husk, MD      Allergies    Patient has no known allergies.    Review of Systems   Review of Systems  Constitutional:  Negative for chills and fever.  Eyes:  Negative for visual disturbance.  Respiratory:  Negative for shortness of breath.   Cardiovascular:  Negative for chest pain.  Gastrointestinal:  Negative for abdominal pain, nausea and vomiting.  Genitourinary:  Negative for difficulty urinating and dysuria.  Musculoskeletal:  Positive for myalgias. Negative for back pain and neck pain.  Skin:  Negative for color change and rash.  Neurological:  Negative for dizziness, syncope, light-headedness and headaches.   Psychiatric/Behavioral:  Positive for hallucinations. Negative for confusion and suicidal ideas.    Physical Exam Updated Vital Signs BP (!) 150/107 (BP Location: Right Arm)    Pulse 94    Temp (!) 97.5 F (36.4 C) (Oral)    Resp 16    SpO2 100%   Physical Exam Vitals and nursing note reviewed.  Constitutional:      General: He is not in acute distress.    Appearance: He is not ill-appearing, toxic-appearing or diaphoretic.  HENT:     Head: Normocephalic.  Eyes:     General: No scleral icterus.       Right eye: No discharge.        Left eye: No discharge.  Cardiovascular:     Rate and Rhythm: Normal rate.  Pulmonary:     Effort: Pulmonary effort is normal.  Musculoskeletal:     Cervical back: Tenderness present. No swelling, edema, deformity, erythema, signs of trauma, lacerations, rigidity, spasms, torticollis, bony tenderness or crepitus. No pain with movement. Normal range of motion.     Thoracic back: Tenderness present. No swelling, edema, deformity, signs of trauma, lacerations, spasms or bony tenderness.     Lumbar back: No swelling, edema, deformity, signs of trauma, lacerations, spasms, tenderness or bony tenderness.     Comments: Midline tenderness or deformity to cervical, thoracic, or lumbar spine.  Patient has tenderness to bilateral trapezius muscles.  No ecchymosis, lacerations, or deformity noted.  Patient moves all limbs equally without difficulty.  Patient able to stand and ambulate without difficulty.  Skin:  General: Skin is warm and dry.  Neurological:     General: No focal deficit present.     Mental Status: He is alert.     GCS: GCS eye subscore is 4. GCS verbal subscore is 5. GCS motor subscore is 6.  Psychiatric:        Attention and Perception: He perceives auditory hallucinations. He does not perceive visual hallucinations.        Behavior: Behavior is cooperative.        Thought Content: Thought content does not include homicidal or suicidal  ideation.    ED Results / Procedures / Treatments   Labs (all labs ordered are listed, but only abnormal results are displayed) Labs Reviewed - No data to display  EKG None  Radiology No results found.  Procedures Procedures    Medications Ordered in ED Medications - No data to display  ED Course/ Medical Decision Making/ A&P                           Medical Decision Making  This patient presents to the ED for concern of alcohol and cocaine detox, this involves an extensive number of treatment options, and is a complaint that carries with it a high risk of complications and morbidity.     Co morbidities that complicate the patient evaluation  Alcohol use, polysubstance use, cocaine induced psychosis   Additional history obtained:  Additional history obtained from patient External records from outside source obtained and reviewed including notes from ED provider on 1/4 and notes from psychiatry from 1/5.    Test Considered:  EKG,      Problem List / ED Course:  Alcohol use, polysubstance use Patient presents requesting detox.  States that he drank 3 beers and used cocaine approximately 4 hours prior.  Per chart review patient was seen in the emergency department on 1/4 for multiple complaints including suicidal ideation and alcohol withdrawal.  Patient was medically cleared and then transferred to health.  Patient left to be held earlier this morning after requesting to leave.  Patient was offered samples of psychiatric medications he was started on but declined.  Patient did not meet criteria for IVC.  Denies any SI, HI, or AVH to behavioral health in place.  Patient was given resources to follow-up in the outpatient setting.   Reevaluation:  After the interventions noted above, I reevaluated the patient and found that they have :stayed the same   Social Determinants of Health:  Homelessness   Dispostion:  After consideration of the diagnostic  results and the patients response to treatment, I feel that the patent would benefit from discharge and follow-up in outpatient setting with psychiatry.    Patient was given resources for shelters and substance use counseling.  Patient given information to follow-up with [place] and wellness center.  Patient given information to follow-up behavioral health urgent care.  Discussed results, findings, treatment and follow up. Patient advised of return precautions. Patient verbalized understanding and agreed with plan.  Patient care discussed with attending physician Dr. Tinnie Gens        Final Clinical Impression(s) / ED Diagnoses Final diagnoses:  Alcohol use  Crack cocaine use    Rx / DC Orders ED Discharge Orders     None         Loni Beckwith, PA-C 08/16/21 0343    Teressa Lower, MD 08/16/21 [phone]

## 2021-08-16 NOTE — ED Triage Notes (Addendum)
Patient requesting assistance detoxing from crack cocaine and alcohol

## 2021-08-16 NOTE — Discharge Instructions (Addendum)
You came to the emergency department today due to concerns for detox.  Detox is not performed in the emergency department and therefore he were discharged.  I have given you information to follow-up with resources in the outpatient setting concerning detox and psychiatric management.  I have also given you resources for local shelters in the area.  Please go to the behavioral health urgent care if you have any psychiatric concerns.  Please follow-up with [place] and wellness center for further management of your health.  Get help right away if: You have thoughts about hurting yourself or others.

## 2021-08-16 NOTE — ED Notes (Signed)
Pt discharged with  AVS.  AVS reviewed prior to discharge.  Pt alert, oriented, and ambulatory.  Safety maintained.  °

## 2021-08-16 NOTE — Progress Notes (Signed)
CSW spoke with patient regarding residential treatment programs. Patient reports that he has no phone.  CSW advised patient call residential program or go to the residential program to seek admission.    Kiing Deakin, LCSW, LCAS Clincal Social Worker  [facility]
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
## 2021-08-16 NOTE — Discharge Instructions (Addendum)

## 2021-08-16 NOTE — Progress Notes (Signed)
TRIAGE: ROUTINE   08/16/21 0500  [facility] Triage Screening (Walk-ins at [facility] only)  How Did You Hear About [place]? Self  What Is the Reason for Your Visit/Call Today? Pt states, "I left here this morning - I was still hearing voices and I was still addicted to crack and drinking." Per [facility] MD, "patient requested to be discharged. Patient denied SI/HI/AVH; he appeared less paranoid than yesterday and did not meet criteria for IVC and patient was discharged per request. He declined waiting for medication samples and was provided printed paper scripts on discharge as well as information about outpatient psychiatric and substance use services by SW." Per [facility] nurse, "Patient asked for discharge.  He was somewhat intense and compared unit to prison.  He was reassured that we are a voluntary unit.  Dr Bronwen Betters and Baldo Daub made aware and came to speak with him.  He could not give a definite reason for wanting to leave however writer had heard him on the phone to his bank shortly before he verbalized his desire to discharge." Pt denies SI, though he states he experienced SI this morning. He shares he attempted to kill himself by o/d "a long time ago" but denies a plan to kill himself at this time. Pt denies HI, AVH (though he states, "I think people are following and watching me."), NSSIB, and access to guns/weapons. Pt states he has a court date scheduled in Febuary 2023 for misdemeanor tresspassing. Pt shares he used $150 crack today; he states he started using crack one month ago and that he typically uses on a daily basis. Pt shares he drank 1 pint of liquor today; he states he started using EtOH upon his d/c from prison one year ago and that he typically uses on a daily basis.  How Long Has This Been Causing You Problems? 1 wk - 1 month  Have You Recently Had Any Thoughts About Hurting Yourself? Yes  How long ago did you have thoughts about hurting yourself? This morning  Are You Planning to Commit Suicide/Harm  Yourself At This time? No  Have you Recently Had Thoughts About Hurting Someone Bill Greene? No  How long ago did you have thoughts of harming others? Pt denies  Are You Planning To Harm Someone At This Time? No  Are you currently experiencing any auditory, visual or other hallucinations? No  Have You Used Any Alcohol or Drugs in the Past 24 Hours? Yes  How long ago did you use Drugs or Alcohol? Today  What Did You Use and How Much? Pt states he used $150 of crack and drank 1 pint of liquor today  Do you have any current medical co-morbidities that require immediate attention? No  Clinician description of patient physical appearance/behavior: Pt is dressed in an age appropriate manner. He speaks in a low voice. Pt sits hunched over while talking. He is able to answer the questions posed.  What Do You Feel Would Help You the Most Today?  (Pt unsure)  If access to [facility] was not available, would you have sought care in the Emergency Department?  (Pt just left [facility])  Determination of Need Routine (7 days)  Options For Referral Medication Management;Outpatient Therapy;Chemical Dependency Intensive Outpatient Therapy (CDIOP)

## 2021-08-26 ENCOUNTER — Telehealth (HOSPITAL_COMMUNITY): Payer: Self-pay

## 2021-08-26 NOTE — BH Assessment (Signed)
Care Management - [facility] Follow Up Discharges  ° °Writer attempted to make contact with patient today and was unsuccessful.  No voicemail and the phone just rang.    ° °Per chart review, patient was provided with substance abuse outpatient resources  °

## 2021-09-16 ENCOUNTER — Ambulatory Visit: Payer: Self-pay | Admitting: Physician Assistant

## 2021-09-16 ENCOUNTER — Other Ambulatory Visit: Payer: Self-pay

## 2021-09-16 VITALS — BP 141/91 | HR 88 | Temp 98.6°F | Resp 18 | Ht 71.0 in | Wt 216.0 lb

## 2021-09-16 DIAGNOSIS — R030 Elevated blood-pressure reading, without diagnosis of hypertension: Secondary | ICD-10-CM

## 2021-09-16 DIAGNOSIS — Z6830 Body mass index (BMI) 30.0-30.9, adult: Secondary | ICD-10-CM

## 2021-09-16 DIAGNOSIS — G259 Extrapyramidal and movement disorder, unspecified: Secondary | ICD-10-CM

## 2021-09-16 DIAGNOSIS — F4310 Post-traumatic stress disorder, unspecified: Secondary | ICD-10-CM

## 2021-09-16 DIAGNOSIS — E6609 Other obesity due to excess calories: Secondary | ICD-10-CM

## 2021-09-16 DIAGNOSIS — F411 Generalized anxiety disorder: Secondary | ICD-10-CM

## 2021-09-16 DIAGNOSIS — F1410 Cocaine abuse, uncomplicated: Secondary | ICD-10-CM

## 2021-09-16 DIAGNOSIS — F1010 Alcohol abuse, uncomplicated: Secondary | ICD-10-CM

## 2021-09-16 MED ORDER — PRAZOSIN HCL 2 MG PO CAPS: 2.0000 mg | ORAL_CAPSULE | Freq: Every day | ORAL | 1 refills | Status: DC

## 2021-09-16 MED ORDER — OLANZAPINE 10 MG PO TABS: 10.0000 mg | ORAL_TABLET | Freq: Every day | ORAL | 1 refills | Status: DC

## 2021-09-16 MED ORDER — BENZTROPINE MESYLATE 1 MG PO TABS
1.0000 mg | ORAL_TABLET | Freq: Two times a day (BID) | ORAL | 0 refills | Status: DC
Start: 2021-09-16 — End: 2023-02-07

## 2021-09-16 MED ORDER — FLUOXETINE HCL 20 MG PO TABS: 20.0000 mg | ORAL_TABLET | Freq: Every day | ORAL | 1 refills | Status: DC

## 2021-09-16 NOTE — Progress Notes (Signed)
Patient has eaten and taken medication today. Patient denies pain at this time.  

## 2021-09-16 NOTE — Patient Instructions (Signed)
You are going to take Cogentin twice a day for the next 14 days.  Please return to the mobile unit at that time for further evaluation.  Your blood pressure is elevated today, please take your blood pressure on a daily basis, keep a written log and have available for all office visits.  We will reevaluate your blood pressure when you return to the mobile unit in 2 weeks.  Roney Jaffe, PA-C Physician Assistant [facility] [email or web address]   How to Take Your Blood Pressure Blood pressure is a measurement of how strongly your blood is pressing against the walls of your arteries. Arteries are blood vessels that carry blood from your heart throughout your body. Your health care provider takes your blood pressure at each office visit. You can also take your own blood pressure at home with a blood pressure monitor. You may need to take your own blood pressure to: Confirm a diagnosis of high blood pressure (hypertension). Monitor your blood pressure over time. Make sure your blood pressure medicine is working. Supplies needed: Blood pressure monitor. Dining room chair to sit in. Table or desk. Small notebook and pencil or pen. How to prepare To get the most accurate reading, avoid the following for 30 minutes before you check your blood pressure: Drinking caffeine. Drinking alcohol. Eating. Smoking. Exercising. Five minutes before you check your blood pressure: Use the bathroom and urinate so that you have an empty bladder. Sit quietly in a dining room chair. Do not sit in a soft couch or an armchair. Do not talk. How to take your blood pressure To check your blood pressure, follow the instructions in the manual that came with your blood pressure monitor. If you have a digital blood pressure monitor, the instructions may be as follows: Sit up straight in a chair. Place your feet on the floor. Do not cross your ankles or  legs. Rest your left arm at the level of your heart on a table or desk or on the arm of a chair. Pull up your shirt sleeve. Wrap the blood pressure cuff around the upper part of your left arm, 1 inch (2.5 cm) above your elbow. It is best to wrap the cuff around bare skin. Fit the cuff snugly around your arm. You should be able to place only one finger between the cuff and your arm. Position the cord so that it rests in the bend of your elbow. Press the power button. Sit quietly while the cuff inflates and deflates. Read the digital reading on the monitor screen and write the numbers down (record them) in a notebook. Wait 2-3 minutes, then repeat the steps, starting at step 1. What does my blood pressure reading mean? A blood pressure reading consists of a higher number over a lower number. Ideally, your blood pressure should be below 120/80. The first ("top") number is called the systolic pressure. It is a measure of the pressure in your arteries as your heart beats. The second ("bottom") number is called the diastolic pressure. It is a measure of the pressure in your arteries as the heart relaxes. Blood pressure is classified into five stages. The following are the stages for adults who do not have a short-term serious illness or a chronic condition. Systolic pressure and diastolic pressure are measured in a unit called mm Hg (millimeters of mercury).  Normal Systolic pressure: below 120. Diastolic pressure: below 80. Elevated Systolic pressure: 120-129. Diastolic pressure: below 80. Hypertension stage 1 Systolic pressure: 130-139.  Diastolic pressure: 80-89. Hypertension stage 2 Systolic pressure: 140 or above. Diastolic pressure: 90 or above. You can have elevated blood pressure or hypertension even if only the systolic or only the diastolic number in your reading is higher than normal. Follow these instructions at home: Medicines Take over-the-counter and prescription medicines only as  told by your health care provider. Tell your health care provider if you are having any side effects from blood pressure medicine. General instructions Check your blood pressure as often as recommended by your health care provider. Check your blood pressure at the same time every day. Take your monitor to the next appointment with your health care provider to make sure that: You are using it correctly. It provides accurate readings. Understand what your goal blood pressure numbers are. Keep all follow-up visits as told by your health care provider. This is important. General tips Your health care provider can suggest a reliable monitor that will meet your needs. There are several types of home blood pressure monitors. Choose a monitor that has an arm cuff. Do not choose a monitor that measures your blood pressure from your wrist or finger. Choose a cuff that wraps snugly around your upper arm. You should be able to fit only one finger between your arm and the cuff. You can buy a blood pressure monitor at most drugstores or online. Where to find more information American Heart Association: www.heart.org Contact a health care provider if: Your blood pressure is consistently high. Your blood pressure is suddenly low. Get help right away if: Your systolic blood pressure is higher than 180. Your diastolic blood pressure is higher than 120. Summary Blood pressure is a measurement of how strongly your blood is pressing against the walls of your arteries. A blood pressure reading consists of a higher number over a lower number. Ideally, your blood pressure should be below 120/80. Check your blood pressure at the same time every day. Avoid caffeine, alcohol, smoking, and exercise for 30 minutes prior to checking your blood pressure. These agents can affect the accuracy of the blood pressure reading. This information is not intended to replace advice given to you by your health care provider. Make  sure you discuss any questions you have with your health care provider. Document Revised: 06/06/2020 Document Reviewed: 07/22/2019 Elsevier Patient Education  2022 [employer].

## 2021-09-16 NOTE — Progress Notes (Signed)
New Patient Office Visit  Subjective:  Patient ID: Bill Greene, male    DOB: [birth year]  Age: 46 y.o. MRN: [id number]  CC:  Chief Complaint  Patient presents with   Medication Refill    HPI Bill Greene   states that he is currently being treated for substance abuse at [facility] residential treatment center, states that he has been there for approximately 20 days and will be going back home after he completes his program, states that he lives in [place].  States that he has had a overall feeling of restlessness, uncontrollable movement in his right index finger, states that this has been present since arriving at [facility].  States that he was recently started on Zyprexa 10 mg, states that he does take this at bedtime.  States he does feel that the Zyprexa is offering relief and moods are stable.  History reviewed. No pertinent past medical history.  History reviewed. No pertinent surgical history.  Family History  Problem Relation Age of Onset   Healthy Mother     Social History   Socioeconomic History   Marital status: Single    Spouse name: Not on file   Number of children: Not on file   Years of education: Not on file   Highest education level: Not on file  Occupational History   Not on file  Tobacco Use   Smoking status: Every Day   Smokeless tobacco: Never  Substance and Sexual Activity   Alcohol use: Yes    Comment: 5th   Drug use: Yes    Types: Cocaine   Sexual activity: Not on file  Other Topics Concern   Not on file  Social History Narrative   Not on file   Social Determinants of Health   Financial Resource Strain: Not on file  Food Insecurity: Not on file  Transportation Needs: Not on file  Physical Activity: Not on file  Stress: Not on file  Social Connections: Not on file  Intimate Partner Violence: Not on file    ROS Review of Systems  Constitutional: Negative.   HENT: Negative.    Eyes: Negative.   Respiratory:  Negative for  shortness of breath.   Cardiovascular:  Negative for chest pain.  Gastrointestinal: Negative.   Endocrine: Negative.   Genitourinary: Negative.   Musculoskeletal: Negative.   Skin: Negative.   Allergic/Immunologic: Negative.   Neurological:  Positive for tremors.  Hematological: Negative.   Psychiatric/Behavioral: Negative.  Negative for self-injury, sleep disturbance and suicidal ideas. The patient is not nervous/anxious.    Objective:   Today's Vitals: BP (!) 141/91 (BP Location: Left Arm, Patient Position: Sitting, Cuff Size: Normal)    Pulse 88    Temp 98.6 F (37 C) (Oral)    Resp 18    Ht 5\' 11"  (1.803 m)    Wt 216 lb (98 kg)    SpO2 99%    BMI 30.13 kg/m   Physical Exam Vitals and nursing note reviewed.  Constitutional:      General: He is not in acute distress.    Appearance: Normal appearance. He is ill-appearing.  HENT:     Head: Normocephalic and atraumatic.     Right Ear: External ear normal.     Left Ear: External ear normal.     Nose: Nose normal.     Mouth/Throat:     Mouth: Mucous membranes are moist.     Pharynx: Oropharynx is clear.  Eyes:     Extraocular Movements:  Extraocular movements intact.     Conjunctiva/sclera: Conjunctivae normal.     Pupils: Pupils are equal, round, and reactive to light.  Cardiovascular:     Rate and Rhythm: Normal rate and regular rhythm.     Pulses: Normal pulses.     Heart sounds: Normal heart sounds.  Pulmonary:     Effort: Pulmonary effort is normal.     Breath sounds: Normal breath sounds.  Musculoskeletal:     Right hand: Normal. No swelling. Normal range of motion.     Left hand: Normal. No swelling. Normal range of motion.     Cervical back: Normal range of motion and neck supple.     Comments: Tremor noted in right index finger   Skin:    General: Skin is warm and dry.  Neurological:     General: No focal deficit present.     Mental Status: He is alert and oriented to person, place, and time.     Cranial  Nerves: No cranial nerve deficit.     Sensory: No sensory deficit.     Motor: No weakness.  Psychiatric:        Mood and Affect: Mood normal.        Behavior: Behavior normal.        Thought Content: Thought content normal.        Judgment: Judgment normal.    Assessment & Plan:   Problem List Items Addressed This Visit       Nervous and Auditory   Extrapyramidal and movement disorder   Relevant Medications   benztropine (COGENTIN) 1 MG tablet     Other   Cocaine use disorder (HCC)   GAD (generalized anxiety disorder) - Primary   Relevant Medications   FLUoxetine (PROZAC) 20 MG tablet   PTSD (post-traumatic stress disorder)   Relevant Medications   OLANZapine (ZYPREXA) 10 MG tablet   prazosin (MINIPRESS) 2 MG capsule   FLUoxetine (PROZAC) 20 MG tablet   Alcohol abuse   Other Visit Diagnoses     Elevated blood pressure reading in office without diagnosis of hypertension       Class 1 obesity due to excess calories with body mass index (BMI) of 30.0 to 30.9 in adult, unspecified whether serious comorbidity present           Outpatient Encounter Medications as of 09/16/2021  Medication Sig   benztropine (COGENTIN) 1 MG tablet Take 1 tablet (1 mg total) by mouth 2 (two) times daily for 14 days.   FLUoxetine (PROZAC) 20 MG tablet Take 1 tablet (20 mg total) by mouth daily.   prazosin (MINIPRESS) 2 MG capsule Take 1 capsule (2 mg total) by mouth at bedtime. Hold if BP is less than 110/60   OLANZapine (ZYPREXA) 10 MG tablet Take 1 tablet (10 mg total) by mouth at bedtime.   [DISCONTINUED] gabapentin (NEURONTIN) 100 MG capsule Take 1 capsule (100 mg total) by mouth 3 (three) times daily.   [DISCONTINUED] OLANZapine (ZYPREXA) 5 MG tablet Take 1 tablet (5 mg total) by mouth 2 (two) times daily.   No facility-administered encounter medications on file as of 09/16/2021.  1. GAD (generalized anxiety disorder) Continue current regimen.  Patient to return to mobile unit in 2  weeks  Patient had recent TSH, CBC, CMP, that were within normal limits.  Did have lipid panel completed, however patient states he was not fasting.  We will repeat fasting lipid panel at next office visit  2. PTSD (post-traumatic stress disorder)  Continue current regimen - OLANZapine (ZYPREXA) 10 MG tablet; Take 1 tablet (10 mg total) by mouth at bedtime.  Dispense: 30 tablet; Refill: 1 - prazosin (MINIPRESS) 2 MG capsule; Take 1 capsule (2 mg total) by mouth at bedtime. Hold if BP is less than 110/60  Dispense: 30 capsule; Refill: 1 - FLUoxetine (PROZAC) 20 MG tablet; Take 1 tablet (20 mg total) by mouth daily.  Dispense: 30 tablet; Refill: 1  3. Extrapyramidal and movement disorder Trial Cogentin, patient education given on supportive care, red flags for prompt reevaluation - benztropine (COGENTIN) 1 MG tablet; Take 1 tablet (1 mg total) by mouth 2 (two) times daily for 14 days.  Dispense: 28 tablet; Refill: 0  4. Elevated blood pressure reading in office without diagnosis of hypertension Patient encouraged to check blood pressure on a daily basis, keep a written log and have available for all office visits.  5. Cocaine use disorder (HCC) Currently in substance abuse treatment program  6. Alcohol abuse   I have reviewed the patient's medical history (PMH, PSH, Social History, Family History, Medications, and allergies) , and have been updated if relevant. I spent 30 minutes reviewing chart and  face to face time with patient.     Follow-up: Return in about 2 weeks (around 09/30/2021).   Kasandra Knudsen Mayers, PA-C

## 2021-09-17 DIAGNOSIS — F411 Generalized anxiety disorder: Secondary | ICD-10-CM | POA: Insufficient documentation

## 2021-09-17 DIAGNOSIS — F4310 Post-traumatic stress disorder, unspecified: Secondary | ICD-10-CM | POA: Insufficient documentation

## 2021-09-17 DIAGNOSIS — G259 Extrapyramidal and movement disorder, unspecified: Secondary | ICD-10-CM | POA: Insufficient documentation

## 2021-09-17 DIAGNOSIS — F1010 Alcohol abuse, uncomplicated: Secondary | ICD-10-CM | POA: Insufficient documentation

## 2022-04-08 ENCOUNTER — Ambulatory Visit (HOSPITAL_COMMUNITY)
Admission: EM | Admit: 2022-04-08 | Discharge: 2022-04-08 | Disposition: A | Discharge status: Home / Self Care | Payer: Self-pay | Attending: Emergency Medicine | Admitting: Emergency Medicine

## 2022-04-08 ENCOUNTER — Encounter (HOSPITAL_COMMUNITY): Payer: Self-pay

## 2022-04-08 DIAGNOSIS — M5442 Lumbago with sciatica, left side: Secondary | ICD-10-CM

## 2022-04-08 MED ORDER — KETOROLAC TROMETHAMINE 30 MG/ML IJ SOLN
INTRAMUSCULAR | Status: AC
  Filled 2022-04-08: qty 1

## 2022-04-08 MED ORDER — CYCLOBENZAPRINE HCL 10 MG PO TABS
10.0000 mg | ORAL_TABLET | Freq: Two times a day (BID) | ORAL | 0 refills | Status: DC | PRN
Start: 2022-04-08 — End: 2023-02-07

## 2022-04-08 MED ORDER — PREDNISONE 10 MG (21) PO TBPK: ORAL_TABLET | Freq: Every day | ORAL | 0 refills | Status: DC

## 2022-04-08 MED ORDER — KETOROLAC TROMETHAMINE 30 MG/ML IJ SOLN
30.0000 mg | Freq: Once | INTRAMUSCULAR | Status: AC
  Administered 2022-04-08: 30 mg via INTRAMUSCULAR

## 2022-04-08 NOTE — ED Provider Notes (Signed)
[facility]    CSN: [id number] Arrival date & time: 04/08/22  1705      History   Chief Complaint Chief Complaint  Patient presents with   Back Pain   Leg Pain    HPI Bill Greene is a 46 y.o. male.   Patient presents with constant left lower back pain radiating down to the left lower leg and foot for 7 to 8 months.  Pain occurs daily and is worsened with sitting as well as with range of motion occasionally causing patient to limp.  Tingling and numbness can be felt in all 5 toes and there is a burning sensation to the calf muscle.  Denies precipitating event, trauma or injury.  Endorses he has attempted use of Tylenol and Aleve consistently for the last few months without any signs of relief.  Denies urinary or bowel incontinence.  Endorses a history of a meniscus tear to the left knee requiring surgical intervention.    History reviewed. No pertinent past medical history.  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 Authorizing Provider  benztropine (COGENTIN) 1 MG tablet Take 1 tablet (1 mg total) by mouth 2 (two) times daily for 14 days. 09/16/21 09/30/21  Mayers, Cari S, PA-C  FLUoxetine (PROZAC) 20 MG tablet Take 1 tablet (20 mg total) by mouth daily. 09/16/21   Mayers, Cari S, PA-C  OLANZapine (ZYPREXA) 10 MG tablet Take 1 tablet (10 mg total) by mouth at bedtime. 09/16/21 10/16/21  Mayers, Cari S, PA-C   prazosin (MINIPRESS) 2 MG capsule Take 1 capsule (2 mg total) by mouth at bedtime. Hold if BP is less than 110/60 09/16/21   Mayers, Kasandra Knudsen, PA-C    Family History Family History  Problem Relation Age of Onset   Healthy Mother     Social History Social History   Tobacco Use   Smoking status: Every Day   Smokeless tobacco: Never  Substance Use Topics   Alcohol use: Yes    Comment: 5th   Drug use: Yes    Types: Cocaine     Allergies   Patient has no known allergies.   Review of Systems Review of Systems  Constitutional: Negative.   Respiratory: Negative.    Cardiovascular: Negative.   Musculoskeletal:  Positive for back pain. Negative for arthralgias, gait problem, joint swelling, myalgias, neck pain and neck stiffness.  Skin: Negative.   Neurological: Negative.      Physical Exam Triage Vital Signs ED Triage Vitals  Enc Vitals Group     BP 04/08/22 1821 115/76     Pulse Rate 04/08/22 1821 79     Resp 04/08/22 1821 16     Temp 04/08/22 1821 99 F (37.2 C)     Temp Source 04/08/22 1821 Oral     SpO2 04/08/22 1821 95 %     Weight --      Height --  Head Circumference --      Peak Flow --      Pain Score 04/08/22 1820 8     Pain Loc --      Pain Edu? --      Excl. in GC? --    No data found.  Updated Vital Signs BP 115/76 (BP Location: Left Arm)   Pulse 79   Temp 99 F (37.2 C) (Oral)   Resp 16   SpO2 95%   Visual Acuity Right Eye Distance:   Left Eye Distance:   Bilateral Distance:    Right Eye Near:   Left Eye Near:    Bilateral Near:     Physical Exam Constitutional:      Appearance: Normal appearance.  HENT:     Head: Normocephalic.  Eyes:     Extraocular Movements: Extraocular movements intact.  Cardiovascular:     Pulses: Normal pulses.     Heart sounds: Normal heart sounds.  Pulmonary:     Effort: Pulmonary effort is normal.     Breath sounds: Normal breath sounds.  Musculoskeletal:     Comments: Tenderness present over  the left lumbar region extending into the buttocks and the left hip without ecchymosis, swelling or deformity, able to sit on exam table erect without complication, able to walk tenderness present over the left lumbar region extending into the buttocks and the left hip without ecchymosis, swelling or deformity, able to sit on exam table erect without complication, able to walk  Skin:    General: Skin is warm and dry.  Neurological:     Mental Status: He is alert and oriented to person, place, and time. Mental status is at baseline.  Psychiatric:        Mood and Affect: Mood normal.        Behavior: Behavior normal.      UC Treatments / Results  Labs (all labs ordered are listed, but only abnormal results are displayed) Labs Reviewed - No data to display  EKG   Radiology No results found.  Procedures Procedures (including critical care time)  Medications Ordered in UC Medications - No data to display  Initial Impression / Assessment and Plan / UC Course  I have reviewed the triage vital signs and the nursing notes.  Pertinent labs & imaging results that were available during my care of the patient were reviewed by me and considered in my medical decision making (see chart for details).  Acute left-sided low back pain with left-sided sciatica  Etiology is most likely muscular, discussed with patient, no spinal tenderness is noted on exam, given injection of Toradol and prescribed prednisone taper as well as Flexeril for outpatient use, recommended RICE, heat, massage daily stretching and activity as tolerated, given walker referral to orthopedics if symptoms persist or worsen, given work note Final Clinical Impressions(s) / UC Diagnoses   Final diagnoses:  None   Discharge Instructions   None    ED Prescriptions   None    PDMP not reviewed this encounter.   Valinda Hoar, [place] 04/08/22 1914

## 2022-04-08 NOTE — ED Triage Notes (Addendum)
Patient having low back and left leg pain for a few months. Tenderness to the low left back going down into the leg. Patient states unable to wear his pants up on the waist due to the pressure of the waistband on the low left back causing pain. Patient having a hard time bearing weight on the left leg and limping. Has been taking tylenol and aleve to be able to manage pain caused when walking.   Patient states a few months back was exposed to Roy but tested negative. No penile or urinary symptoms.   Patient has medical history of meniscus tear in the left knee that was repaired surgically. States that there is a burning pain in the left lower calf. Patient also having tingling and numbness in the left foot/toes. No known recent falls or injuries.

## 2022-04-08 NOTE — Discharge Instructions (Signed)
Your pain is most likely caused by irritation to the muscles or ligaments.   You have been given a injection of Toradol here in the office today to help reduce inflammation that is most likely aiding to your pain  Starting tomorrow take prednisone every morning as directed on packaging to continue to process above, you may continue use of Tylenol while using this medication  You may use muscle relaxer twice daily as needed for additional comfort, be mindful this medication may make you drowsy  You may use heating pad in 15 minute intervals as needed for additional comfort, or you may find comfort in using ice in 10-15 minutes over affected area  Begin stretching affected area daily for 10 minutes as tolerated to further loosen muscles   When lying down place pillow underneath and between knees for support  Can try sleeping without pillow on firm mattress   Practice good posture: head back, shoulders back, chest forward, pelvis back and weight distributed evenly on both legs  If pain persist after recommended treatment or reoccurs if may be beneficial to follow up with orthopedic specialist for evaluation, this doctor specializes in the bones and can manage your symptoms long-term with options such as but not limited to imaging, medications or physical therapy

## 2022-04-11 ENCOUNTER — Encounter (HOSPITAL_COMMUNITY): Payer: Self-pay

## 2022-04-11 ENCOUNTER — Emergency Department (HOSPITAL_COMMUNITY)
Admission: EM | Admit: 2022-04-11 | Discharge: 2022-04-11 | Disposition: A | Discharge status: Home / Self Care | Payer: 59 | Attending: Emergency Medicine | Admitting: Emergency Medicine

## 2022-04-11 ENCOUNTER — Other Ambulatory Visit: Payer: Self-pay

## 2022-04-11 DIAGNOSIS — S3992XA Unspecified injury of lower back, initial encounter: Secondary | ICD-10-CM | POA: Diagnosis not present

## 2022-04-11 DIAGNOSIS — S39012A Strain of muscle, fascia and tendon of lower back, initial encounter: Secondary | ICD-10-CM | POA: Insufficient documentation

## 2022-04-11 DIAGNOSIS — X58XXXA Exposure to other specified factors, initial encounter: Secondary | ICD-10-CM | POA: Insufficient documentation

## 2022-04-11 DIAGNOSIS — Z202 Contact with and (suspected) exposure to infections with a predominantly sexual mode of transmission: Secondary | ICD-10-CM | POA: Diagnosis not present

## 2022-04-11 DIAGNOSIS — R69 Illness, unspecified: Secondary | ICD-10-CM | POA: Diagnosis not present

## 2022-04-11 MED ORDER — METRONIDAZOLE 500 MG PO TABS: 2000.0000 mg | ORAL_TABLET | Freq: Once | ORAL | 0 refills | Status: AC

## 2022-04-11 NOTE — Discharge Instructions (Signed)
He stated that she has been exposed to trichomonas.  Your sexual partner was tested for STIs and had only tested positive for trichomonas.  I have sent antibiotics into the pharmacy to treat this.  For any worsening symptoms please return to the emergency room otherwise follow-up with your primary care provider.  You are also evaluated at the urgent care couple days ago.  I have sent in a muscle relaxer.  Please pick this up and start taking it.

## 2022-04-11 NOTE — ED Provider Notes (Signed)
[facility]ENC[facility]PT Provider Note   CSN: [id number] Arrival date & time: 04/11/22  1916     History  Chief Complaint  Patient presents with   Pain    Bill Greene is a 46 y.o. male.  46 year old male presents today for evaluation of exposure to trichomonas.  His wife is at bedside.  She states she was tested for STIs and had only tested positive for trichomonas.  He denies penile discharge, pain, dysuria.  He also endorses left low back pain.  Recently evaluated at urgent care.  Has not picked up muscle relaxer, or prednisone that was prescribed to him.  He has been taking Tylenol.  He denies loss of bowel, or bladder control, saddle anesthesia,  unintentional weight loss, history of malignancy.  The history is provided by the patient. No language interpreter was used.       Home Medications Prior to Admission medications   Medication Sig Start Date End Date Taking? Authorizing Provider  metroNIDAZOLE (FLAGYL) 500 MG tablet Take 4 tablets (2,000 mg total) by mouth once for 1 dose. 04/11/22 04/11/22 Yes Zaven Klemens, PA-C  benztropine (COGENTIN) 1 MG tablet Take 1 tablet (1 mg total) by mouth 2 (two) times daily for 14 days. 09/16/21 09/30/21  Mayers, Cari S, PA-C  cyclobenzaprine (FLEXERIL) 10 MG tablet Take 1 tablet (10 mg total) by mouth 2 (two) times daily as needed for muscle spasms. 04/08/22   White, Elita Boone, NP  FLUoxetine (PROZAC) 20 MG tablet Take 1 tablet (20 mg total) by mouth daily. 09/16/21   Mayers, Cari S, PA-C  OLANZapine (ZYPREXA) 10 MG tablet Take 1 tablet (10 mg total) by mouth at bedtime. 09/16/21 10/16/21  Mayers, Cari S, PA-C  prazosin (MINIPRESS) 2 MG capsule Take 1 capsule (2 mg total) by mouth at bedtime. Hold if BP is less than 110/60 09/16/21   Mayers, Cari S, PA-C  predniSONE (STERAPRED UNI-PAK 21 TAB) 10 MG (21) TBPK tablet Take by mouth daily. Take 6 tabs by mouth daily  for 2 days, then 5 tabs for 2 days, then 4 tabs for 2 days, then 3 tabs  for 2 days, 2 tabs for 2 days, then 1 tab by mouth daily for 2 days 04/08/22   Valinda Hoar, NP      Allergies    Patient has no known allergies.    Review of Systems   Review of Systems  Physical Exam Updated Vital Signs BP (!) 135/103   Pulse 65   Temp (!) 97.4 F (36.3 C) (Oral)   Resp 16   Ht 5\' 11"  (1.803 m)   Wt 98 kg   SpO2 99%   BMI 30.13 kg/m  Physical Exam  ED Results / Procedures / Treatments   Labs (all labs ordered are listed, but only abnormal results are displayed) Labs Reviewed - No data to display  EKG None  Radiology No results found.  Procedures Procedures    Medications Ordered in ED Medications - No data to display  ED Course/ Medical Decision Making/ A&P                           Medical Decision Making Risk Prescription drug management.   46 year old male presents today for evaluation of exposure to trichomonas and left low back pain.  Recently evaluated at urgent care and prescribed prednisone, and muscle relaxer.  He has not picked up or started either 1 of  these medications.  His wife is at bedside.  She confirms that she was tested for STIs and had only tested positive for trichomonas.  We will go ahead and treat patient with metronidazole to cover trichomonas.  He denies dysuria, penile discharge, penile lesions.  He is without red flag signs or symptoms concerning for cauda equina, or spinal epidural abscess.  He is without spinal process tenderness.  Discussed picking up previously prescribed medications by urgent care and taking them as directed.  Return precautions to the emergency room discussed.  Patient voices understanding and is in agreement with plan.   Final Clinical Impression(s) / ED Diagnoses Final diagnoses:  Exposure to trichomonas  Strain of lumbar paraspinous muscle, initial encounter    Rx / DC Orders ED Discharge Orders          Ordered    metroNIDAZOLE (FLAGYL) 500 MG tablet   Once        04/11/22 2221               Marita [place], PA-C 04/11/22 2224    Jacalyn Lefevre, MD 04/11/22 2320

## 2022-04-11 NOTE — ED Triage Notes (Signed)
BIB [facility] with c/o generalized pain and ETOH use. State "I might have had trich for too long". EMS reports being unresponsive in the parking lot.

## 2022-07-17 ENCOUNTER — Ambulatory Visit (HOSPITAL_COMMUNITY)
Admission: EM | Admit: 2022-07-17 | Discharge: 2022-07-17 | Disposition: A | Discharge status: Home / Self Care | Payer: 59 | Attending: Internal Medicine | Admitting: Internal Medicine

## 2022-07-17 ENCOUNTER — Telehealth (HOSPITAL_COMMUNITY): Payer: Self-pay

## 2022-07-17 ENCOUNTER — Encounter (HOSPITAL_COMMUNITY): Payer: Self-pay

## 2022-07-17 DIAGNOSIS — M79605 Pain in left leg: Secondary | ICD-10-CM

## 2022-07-17 DIAGNOSIS — M5432 Sciatica, left side: Secondary | ICD-10-CM

## 2022-07-17 MED ORDER — PREDNISONE 20 MG PO TABS: 40.0000 mg | ORAL_TABLET | Freq: Every day | ORAL | 0 refills | Status: AC

## 2022-07-17 MED ORDER — PREDNISONE 20 MG PO TABS
ORAL_TABLET | ORAL | Status: AC
  Filled 2022-07-17: qty 2

## 2022-07-17 MED ORDER — PREDNISONE 20 MG PO TABS: 40.0000 mg | ORAL_TABLET | Freq: Every day | ORAL | 0 refills | Status: DC

## 2022-07-17 MED ORDER — PREDNISONE 20 MG PO TABS
40.0000 mg | ORAL_TABLET | Freq: Once | ORAL | Status: AC
  Administered 2022-07-17: 40 mg via ORAL

## 2022-07-17 NOTE — ED Triage Notes (Signed)
Pt c/o lt hip pain radiating down lt leg for the past 69months and now getting worse. States taking ibuprofen with relief. Denies injury.

## 2022-07-17 NOTE — Discharge Instructions (Addendum)
Your pain is likely due to sciatic nerve inflammation. Start taking prednisone 40 mg by mouth once daily tomorrow morning with breakfast.  Always take this medicine with breakfast/food to avoid stomach upset. Do not take any ibuprofen while you are taking prednisone as these are sister medications and can cause significant stomach problems if taken together.  Apply heat and do gentle range of motion exercises to the left leg.   You may call the orthopedic doctor on your paperwork to schedule an appointment if your symptoms fail to improve in the next 3 to 4 days using prednisone.  If you develop any new or worsening symptoms or do not improve in the next 2 to 3 days, please return.  If your symptoms are severe, please go to the emergency room.  Follow-up with your primary care provider for further evaluation and management of your symptoms as well as ongoing wellness visits.  I hope you feel better!

## 2022-07-17 NOTE — ED Provider Notes (Addendum)
[facility]    CSN: [id number] Arrival date & time: 07/17/22  0944      History   Chief Complaint Chief Complaint  Patient presents with   Hip Pain    HPI Bill Greene is a 46 y.o. male.   Patient presents urgent care for evaluation of left hip/sciatic nerve pain that has been present for the last 8 months but has worsened over the last week or so.  Patient has been using 800 mg of ibuprofen 3-4 times daily to help with the pain.  He states ibuprofen has helped somewhat but he is unable to go to work without taking medications for pain.  Pain starts at the left hip and radiates down the left leg to the groin area and the thigh.  Patient is able to ambulate with a steady gait.  No recent falls, trauma, or injuries to the area.  Denies back pain, saddle anesthesia symptoms, urinary symptoms, penile discharge/rash, and numbness and tingling to the bilateral lower extremities.  Last dose of ibuprofen was approximately 7 hours ago.  No recent antibiotic or steroid use.  No recent medication changes.  He has never been evaluated by an orthopedic provider for this in the past.  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  He believes that this has caused worsening symptoms to his left hip/back.    Hip Pain    History reviewed. No pertinent past medical history.  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 Authorizing Provider  benztropine (COGENTIN) 1 MG tablet Take 1 tablet (1 mg total) by mouth 2 (two) times daily for 14 days. 09/16/21 09/30/21  Mayers, Cari S, PA-C  cyclobenzaprine (FLEXERIL) 10 MG tablet Take 1 tablet (10 mg total) by mouth 2 (two) times daily as needed for muscle spasms. 04/08/22   White, Elita Boone, NP  FLUoxetine (PROZAC) 20 MG tablet Take 1 tablet (20 mg total) by mouth daily. 09/16/21   Mayers, Cari S, PA-C  OLANZapine (ZYPREXA) 10 MG tablet Take 1 tablet (10 mg total) by mouth at bedtime. 09/16/21 10/16/21  Mayers, Cari S, PA-C  prazosin (MINIPRESS) 2 MG capsule Take 1 capsule (2 mg total) by mouth at bedtime. Hold if BP is less than 110/60 09/16/21   Mayers, Cari S, PA-C  predniSONE (DELTASONE) 20 MG tablet Take 2 tablets (40 mg total) by mouth daily for 4 days. 07/17/22 07/21/22  Carlisle Beers, FNP    Family History Family History  Problem Relation Age of Onset   Healthy Mother     Social History Social History   Tobacco Use   Smoking status: Every Day   Smokeless tobacco: Never  Substance Use Topics   Alcohol use: Yes    Comment: 5th   Drug use: Yes    Types: Cocaine  Allergies   Patient has no known allergies.   Review of Systems Review of Systems Per HPI  Physical Exam Triage Vital Signs ED Triage Vitals [07/17/22 1235]  Enc Vitals Group     BP 138/81     Pulse Rate 85     Resp 18     Temp 98 F (36.7 C)     Temp Source Oral     SpO2 97 %     Weight      Height      Head Circumference      Peak Flow      Pain Score 7     Pain Loc      Pain Edu?      Excl. in GC?    No data found.  Updated Vital Signs BP 138/81 (BP Location: Left Arm)   Pulse 85   Temp 98 F (36.7 C) (Oral)   Resp 18   SpO2 97%   Visual Acuity Right Eye Distance:   Left Eye Distance:   Bilateral Distance:    Right Eye Near:   Left Eye Near:    Bilateral Near:     Physical  Exam Vitals and nursing note reviewed.  Constitutional:      Appearance: He is not ill-appearing or toxic-appearing.  HENT:     Head: Normocephalic and atraumatic.     Right Ear: Hearing and external ear normal.     Left Ear: Hearing and external ear normal.     Nose: Nose normal.     Mouth/Throat:     Lips: Pink.  Eyes:     General: Lids are normal. Vision grossly intact. Gaze aligned appropriately.     Extraocular Movements: Extraocular movements intact.     Conjunctiva/sclera: Conjunctivae normal.  Pulmonary:     Effort: Pulmonary effort is normal.  Musculoskeletal:     Cervical back: Normal and neck supple.     Thoracic back: Normal.     Lumbar back: Normal.     Comments: Tenderness to palpation at the left sciatic notch.  No obvious deformity, swelling, redness, or warmth to the area.  Ambulates with steady gait without difficulty.  5/5 strength against resistance with dorsi flexion and plantarflexion of the bilateral lower extremities.  +2 anterior tibialis pulses bilaterally.  Sensation intact to distal lower extremities.  No midline cervical, thoracic, or lumbar tenderness.  Range of motion to the bilateral lower extremities and low back is normal.  Skin:    General: Skin is warm and dry.     Capillary Refill: Capillary refill takes less than 2 seconds.     Findings: No rash.  Neurological:     General: No focal deficit present.     Mental Status: He is alert and oriented to person, place, and time. Mental status is at baseline.     Cranial Nerves: No dysarthria or facial asymmetry.  Psychiatric:        Mood and Affect: Mood normal.        Speech: Speech normal.        Behavior: Behavior normal.        Thought Content: Thought content normal.        Judgment: Judgment normal.      UC Treatments / Results  Labs (all labs ordered are listed, but only abnormal results are displayed) Labs Reviewed - No data to display  EKG   Radiology No results  found.  Procedures Procedures (including critical care time)  Medications  Ordered in UC Medications  predniSONE (DELTASONE) tablet 40 mg (has no administration in time range)    Initial Impression / Assessment and Plan / UC Course  I have reviewed the triage vital signs and the nursing notes.  Pertinent labs & imaging results that were available during my care of the patient were reviewed by me and considered in my medical decision making (see chart for details).   1.  Sciatica of left side and left leg pain Symptoms are consistent with acute sciatica of the left side.  This is a chronic problem for patient, this is an acute flareup of sciatic nerve pain.  We will treat this with prednisone burst 40 mg once daily with breakfast for the next 5 days.  Patient requesting first dose in clinic as his phones are "tight" and he is unable to pick up the medication until tomorrow due to finances.  40 mg prednisone given in clinic.  He may start the burst tomorrow.  Advised to not take any NSAID therapy while taking prednisone as this can cause GI bleeding and stomach upset.  Heat and gentle range of motion exercises recommended.  Walking referral to [facility] care provided should symptoms fail to improve in the next 3 to 4 days while taking prednisone steroid burst.   Discussed physical exam and available lab work findings in clinic with patient.  Counseled patient regarding appropriate use of medications and potential side effects for all medications recommended or prescribed today. Discussed red flag signs and symptoms of worsening condition,when to call the PCP office, return to urgent care, and when to seek higher level of care in the emergency department. Patient verbalizes understanding and agreement with plan. All questions answered. Patient discharged in stable condition.    Final Clinical Impressions(s) / UC Diagnoses   Final diagnoses:  Sciatica of left side  Left leg pain      Discharge Instructions      Your pain is likely due to sciatic nerve inflammation. Start taking prednisone 40 mg by mouth once daily tomorrow morning with breakfast.  Always take this medicine with breakfast/food to avoid stomach upset. Do not take any ibuprofen while you are taking prednisone as these are sister medications and can cause significant stomach problems if taken together.  Apply heat and do gentle range of motion exercises to the left leg.   You may call the orthopedic doctor on your paperwork to schedule an appointment if your symptoms fail to improve in the next 3 to 4 days using prednisone.  If you develop any new or worsening symptoms or do not improve in the next 2 to 3 days, please return.  If your symptoms are severe, please go to the emergency room.  Follow-up with your primary care provider for further evaluation and management of your symptoms as well as ongoing wellness visits.  I hope you feel better!   ED Prescriptions     Medication Sig Dispense Auth. Provider   predniSONE (DELTASONE) 20 MG tablet Take 2 tablets (40 mg total) by mouth daily for 4 days. 8 tablet Carlisle Beers, FNP      PDMP not reviewed this encounter.   Carlisle Beers, FNP 07/17/22 1340    Reita May [place], [place] 07/17/22 1341

## 2023-02-05 IMAGING — CT CT HEAD W/O CM
3 series · 15 of 47 positions shown, 18 images · non-contrast
Comparison: None.

CLINICAL DATA: Head trauma, in need of detox

EXAM:
CT HEAD WITHOUT CONTRAST
CT CERVICAL SPINE WITHOUT CONTRAST
TECHNIQUE: Multidetector CT imaging of the head and cervical spine was
performed following the standard protocol without intravenous
contrast. Multiplanar CT image reconstructions of the cervical spine
were also generated.

[Series 3: head 5.0 h30s · axial · 0.43mm/px · z∈[-128,+12]mm · 9 of 34 slices shown, 12 images]
[im 3/34  brain]
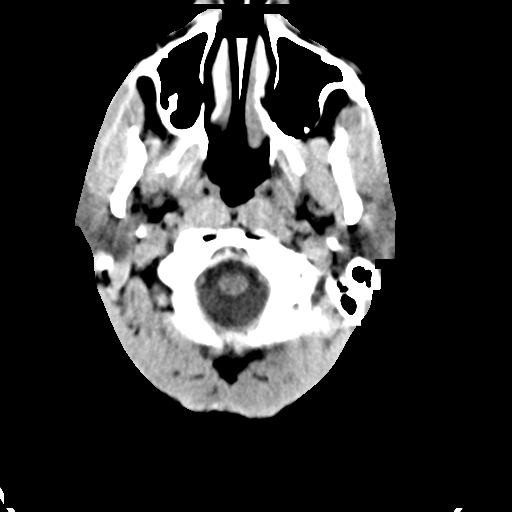
[im 3/34  bone]
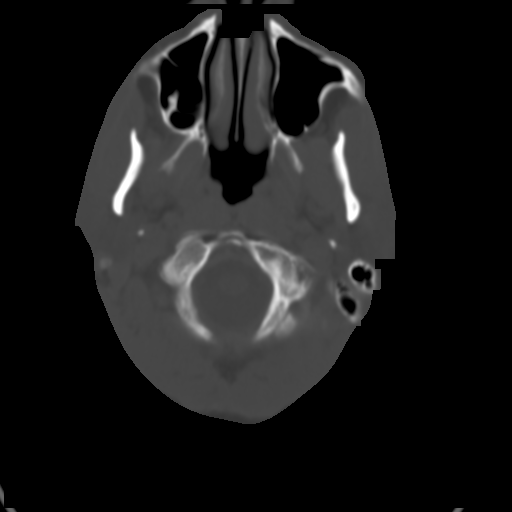
[im 6/34  brain]
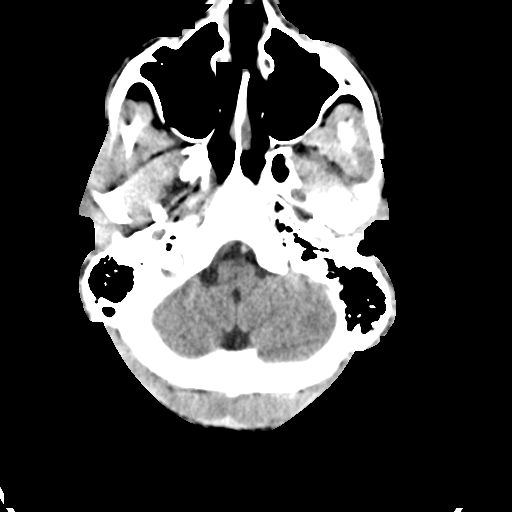
[im 10/34  brain]
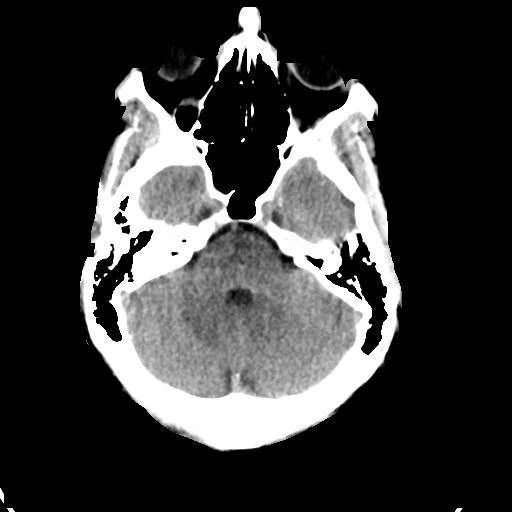
[im 13/34  brain]
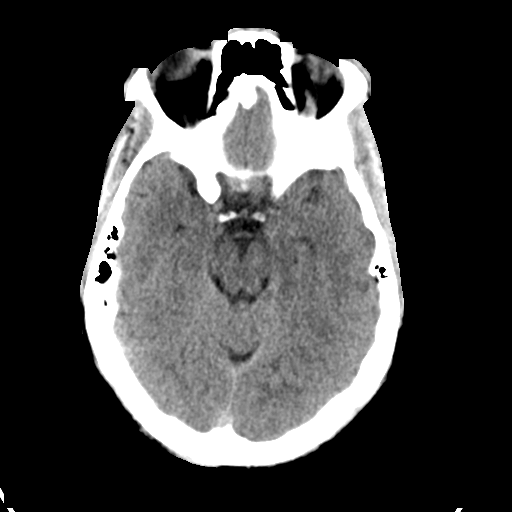
[im 18/34  brain]
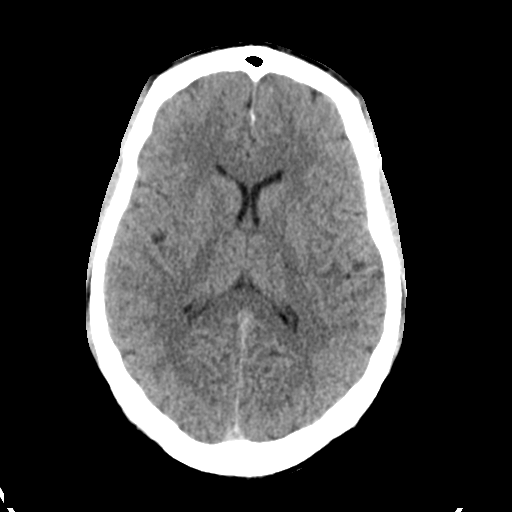
[im 18/34  bone]
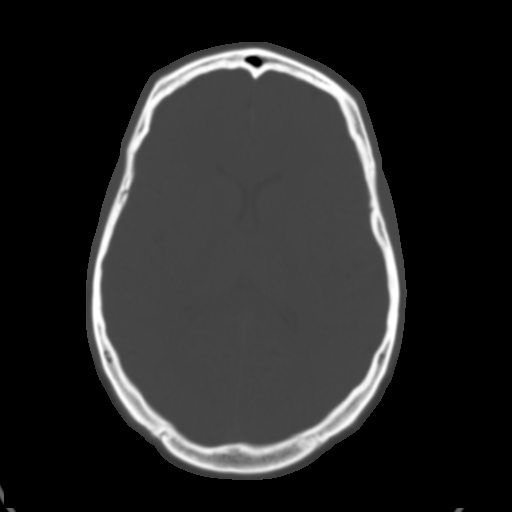
[im 21/34  brain]
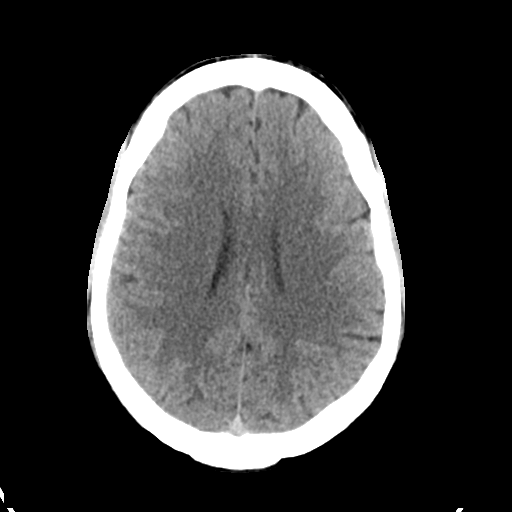
[im 24/34  brain]
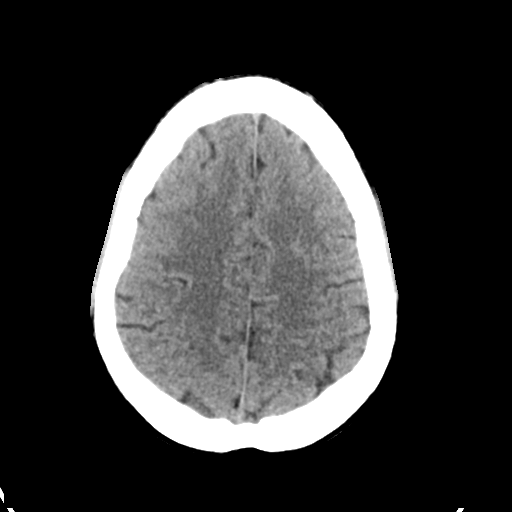
[im 28/34  brain]
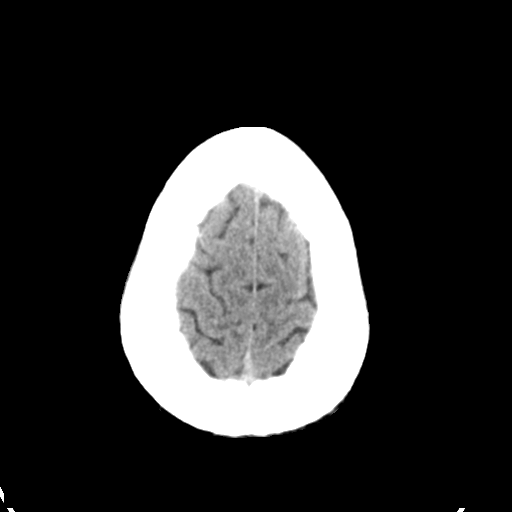
[im 31/34  brain]
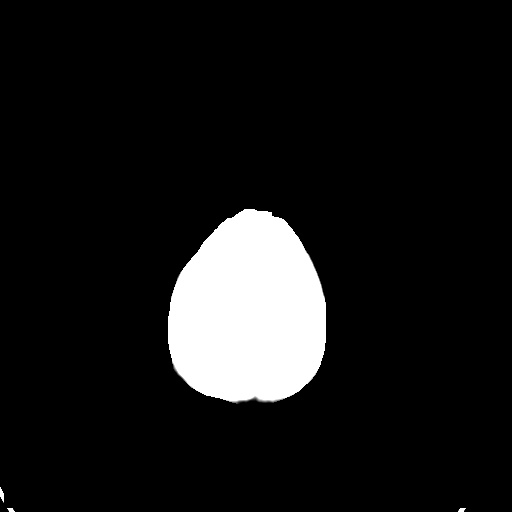
[im 31/34  bone]
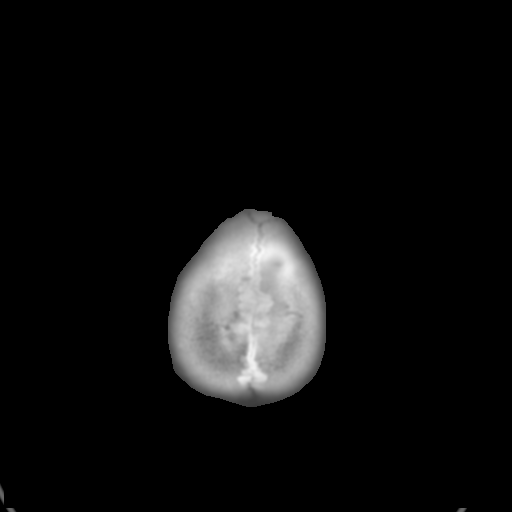

[Series 5: head 3.0 mpr cor · coronal · 0.32mm/px · 3 of 70 slices shown]
[im 24/70  brain]
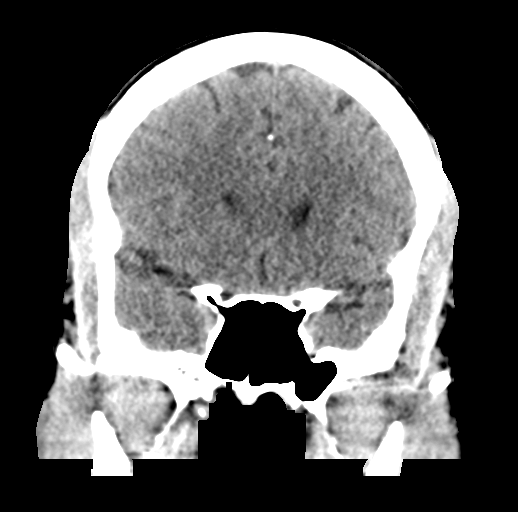
[im 31/70  brain]
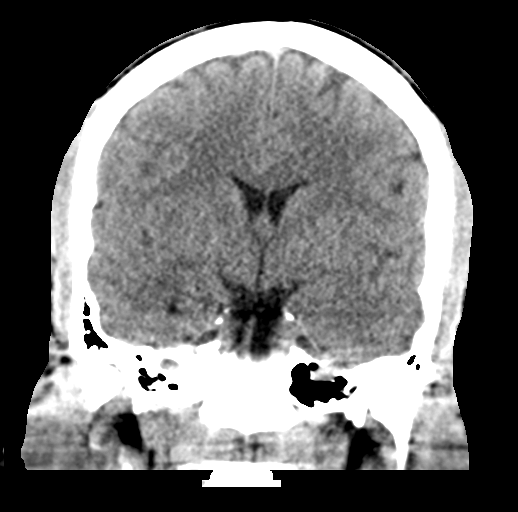
[im 39/70  brain]
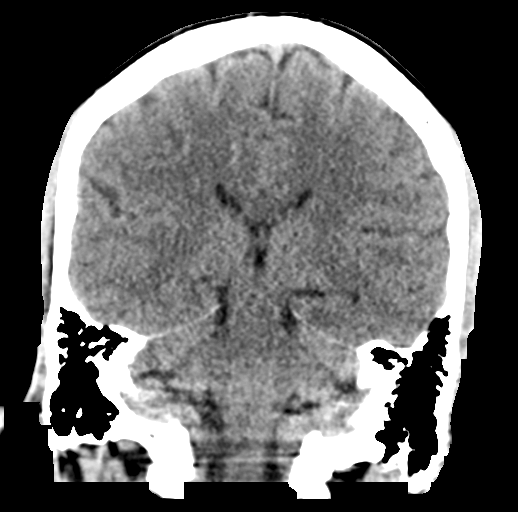

[Series 6: head 3.0 mpr sag · sagittal · 0.32mm/px · 3 of 55 slices shown]
[im 19/55  brain]
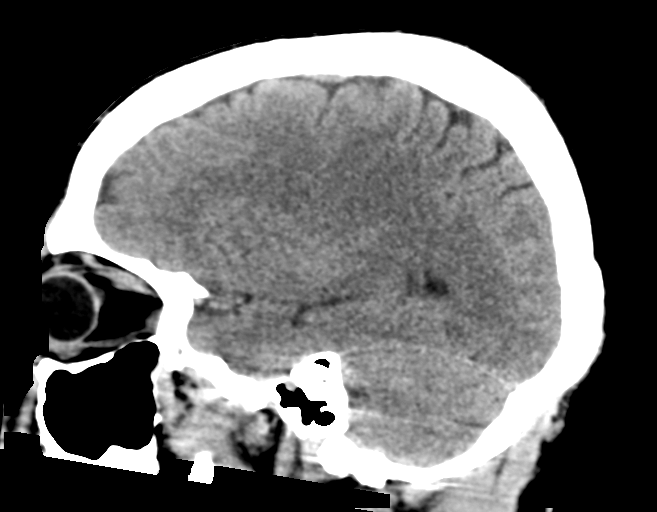
[im 28/55  brain]
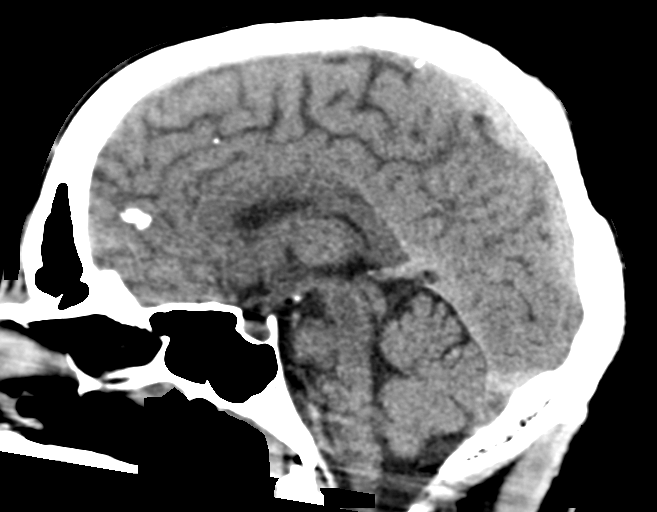
[im 37/55  brain]
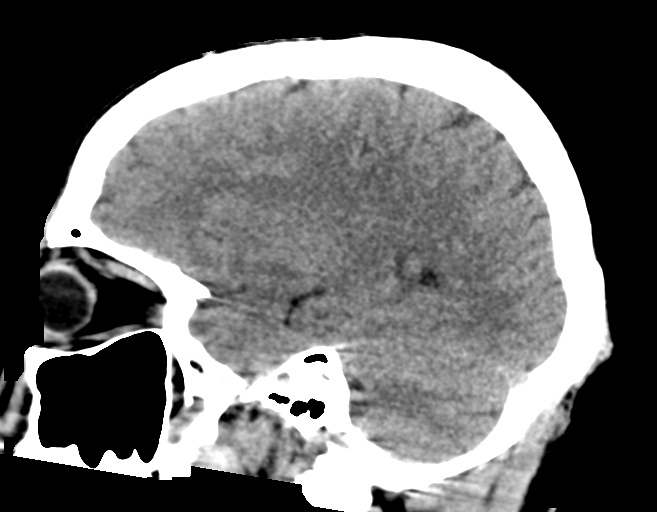

[15 of 47 positions shown; findings below may reference images not displayed]

FINDINGS: CT HEAD FINDINGS

Brain: No evidence of acute infarction, hemorrhage, cerebral edema,
mass, mass effect, or midline shift. No hydrocephalus or extra-axial
fluid collection.

Vascular: No hyperdense vessel.

Skull: Normal. Negative for fracture or focal lesion.

Sinuses/Orbits: No acute finding.

Other: The mastoid air cells are well aerated.

CT CERVICAL SPINE FINDINGS

Alignment: Trace retrolisthesis C5 on C6.

Skull base and vertebrae: No acute fracture. No primary bone lesion
or focal pathologic process.

Soft tissues and spinal canal: No prevertebral fluid or swelling. No
visible canal hematoma.

Disc levels: Multilevel degenerative changes, with disc osteophyte
complexes at C4-C5, C5-C6, and C6-C7, with moderate spinal canal
stenosis at these levels. Multilevel uncovertebral and facet
arthropathy, which causes up to severe neural foraminal narrowing on
the left at C4-C5 and bilaterally at C5-C6 and C6-C7.

Upper chest: Emphysema. No focal pulmonary opacity or pleural
effusion.

Other: None.
IMPRESSION: 1.  No acute intracranial process.
2.  No acute fracture or traumatic listhesis in the cervical spine.

## 2023-02-05 IMAGING — CT CT CERVICAL SPINE W/O CM
3 of 4 series · 13 of 33 positions shown, 16 images · non-contrast
Comparison: None.

CLINICAL DATA: Head trauma, in need of detox

EXAM:
CT HEAD WITHOUT CONTRAST
CT CERVICAL SPINE WITHOUT CONTRAST
TECHNIQUE: Multidetector CT imaging of the head and cervical spine was
performed following the standard protocol without intravenous
contrast. Multiplanar CT image reconstructions of the cervical spine
were also generated.

[Series 5: c_spine 2.0 st · axial · 0.24mm/px · z∈[-266,-156]mm · 5 of 83 slices shown, 7 images]
[im 14/83  soft-tissue]
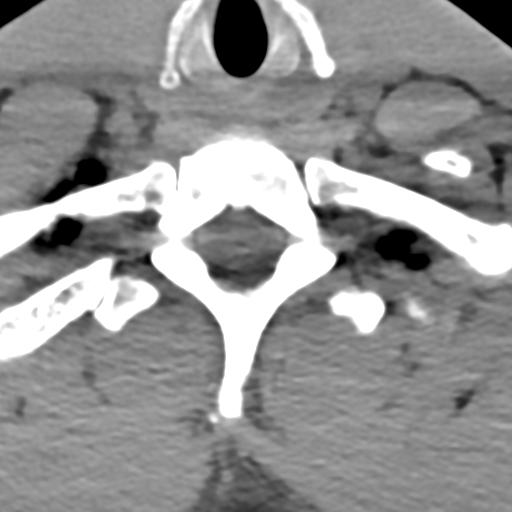
[im 14/83  bone]
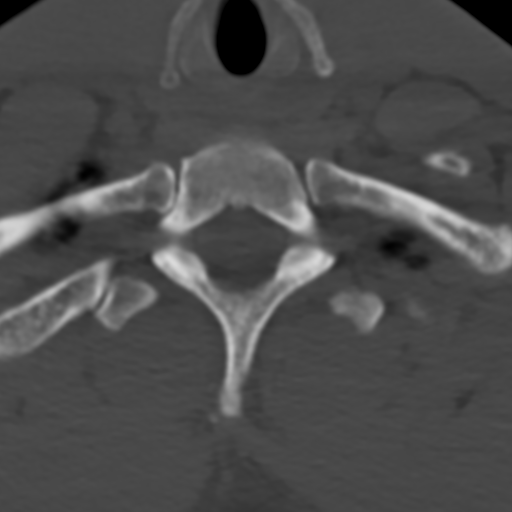
[im 28/83  bone]
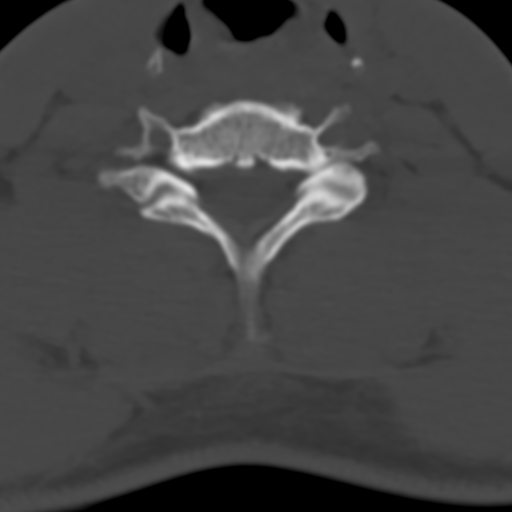
[im 42/83  bone]
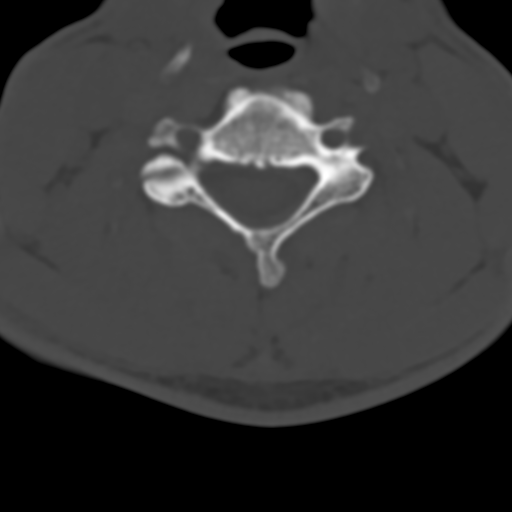
[im 55/83  bone]
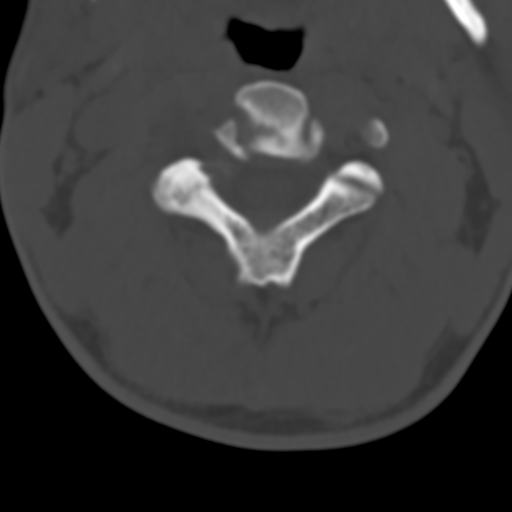
[im 69/83  soft-tissue]
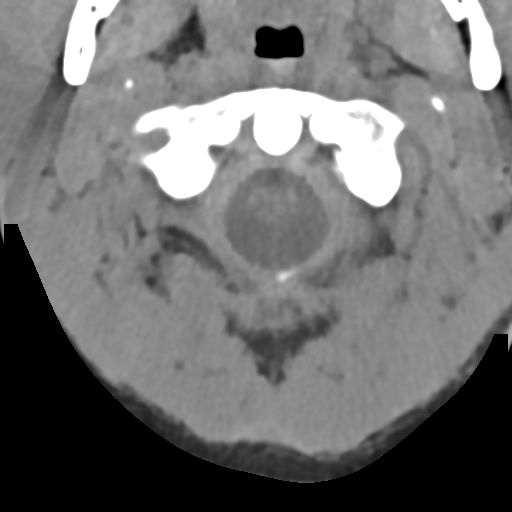
[im 69/83  bone]
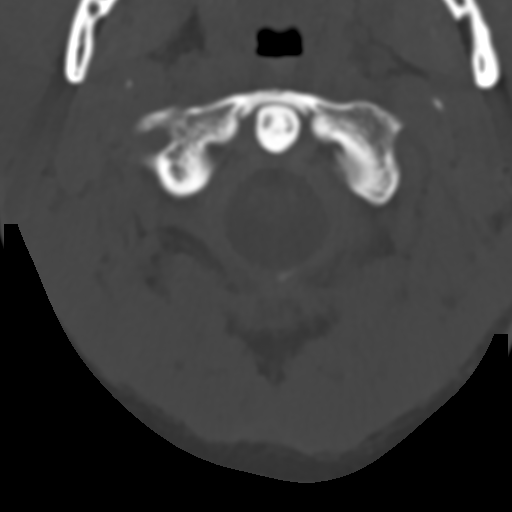

[Series 6: coronal bone · coronal · 0.23mm/px · 3 of 61 slices shown]
[im 13/61  bone]
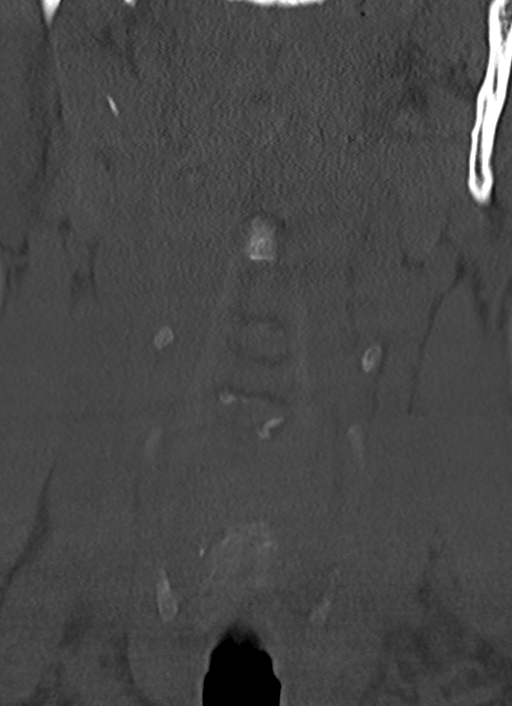
[im 25/61  bone]
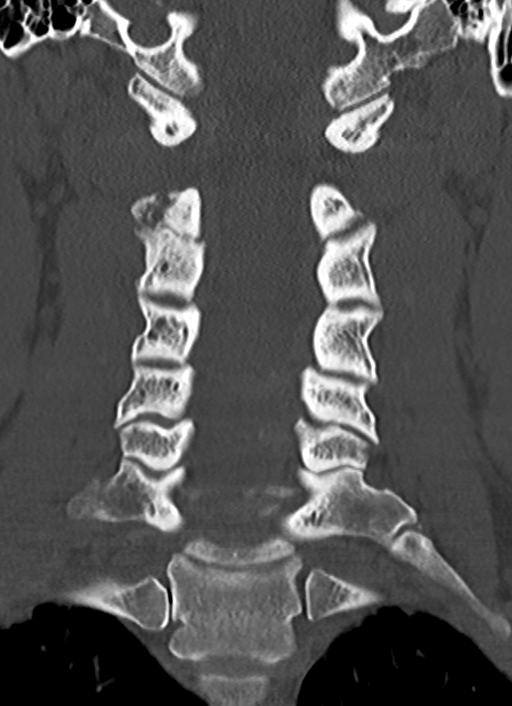
[im 37/61  bone]
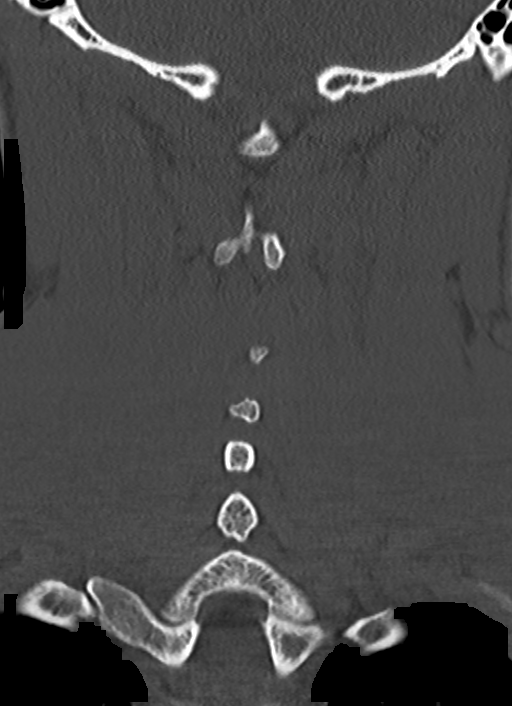

[Series 7: sagittal bone · sagittal · 0.23mm/px · 5 of 61 slices shown, 6 images]
[im 21/61  bone]
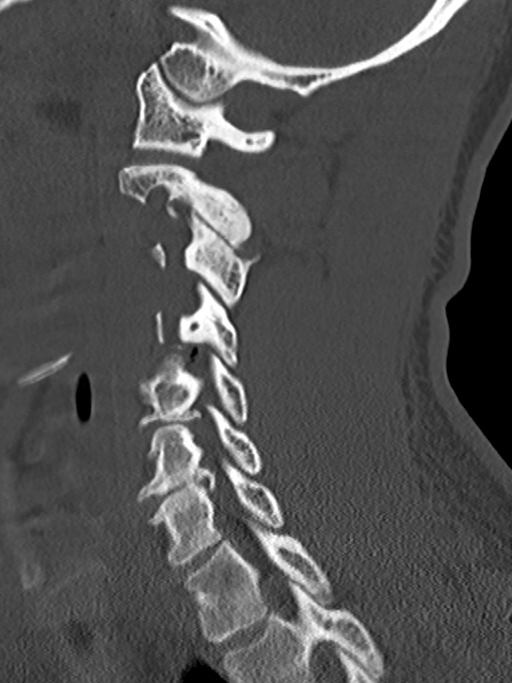
[im 26/61  bone]
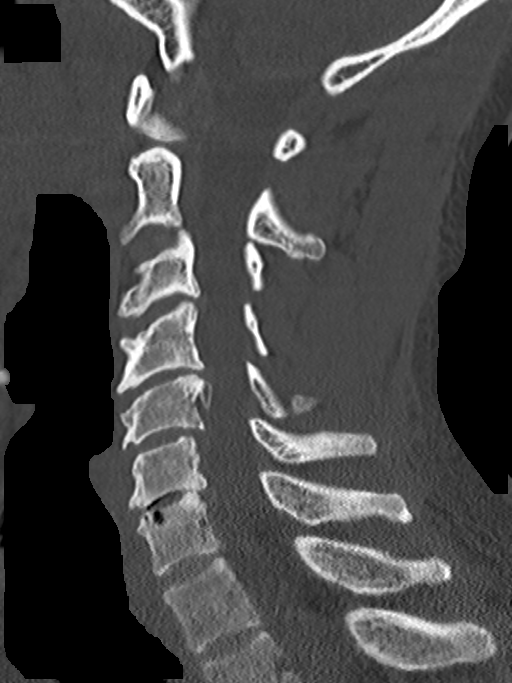
[im 31/61  soft-tissue]
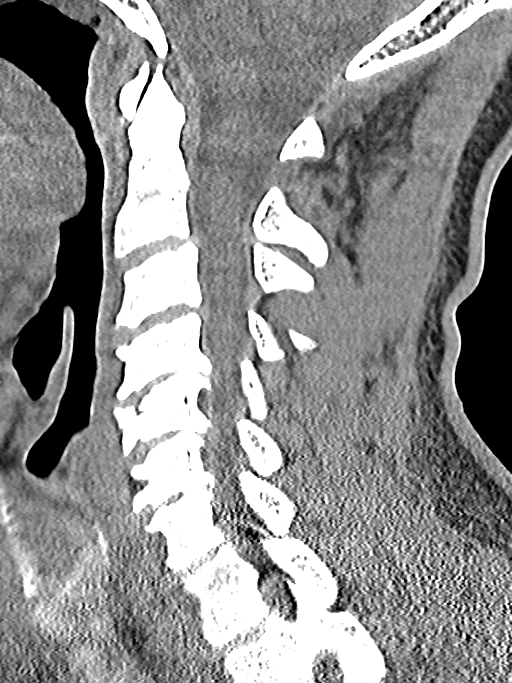
[im 31/61  bone]
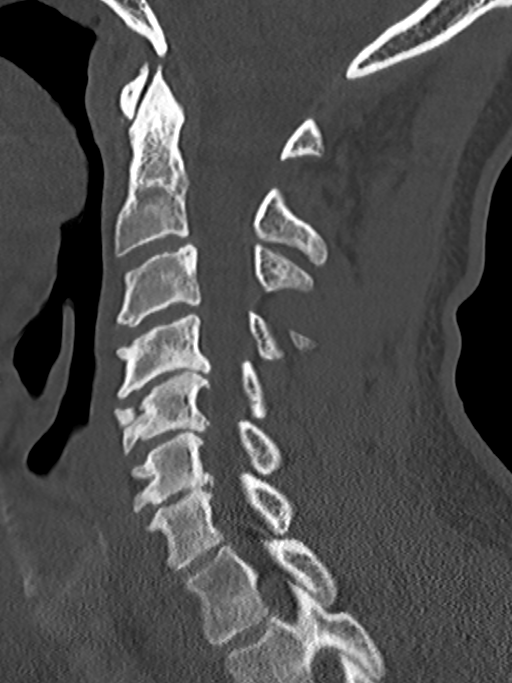
[im 36/61  bone]
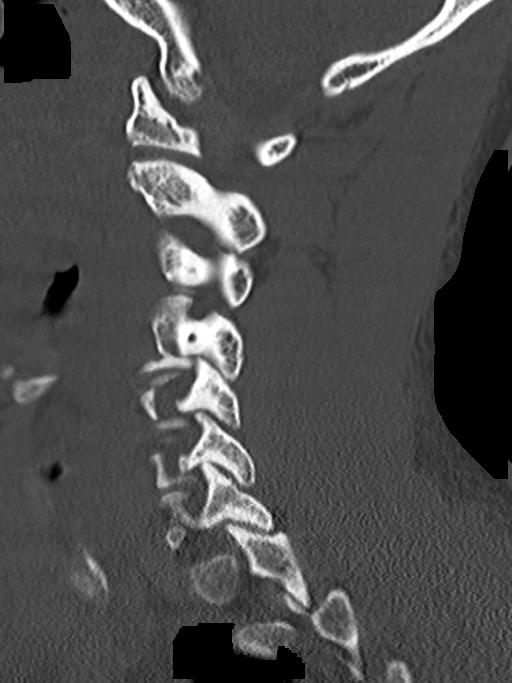
[im 41/61  bone]
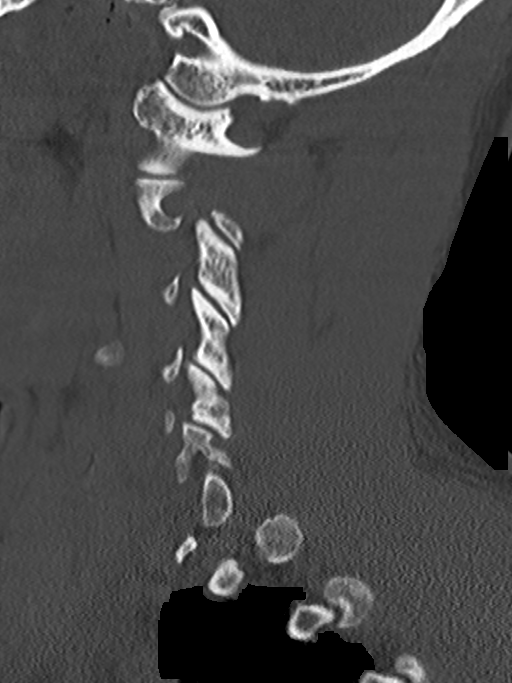

[13 of 33 positions shown; findings below may reference images not displayed]

FINDINGS: CT HEAD FINDINGS

Brain: No evidence of acute infarction, hemorrhage, cerebral edema,
mass, mass effect, or midline shift. No hydrocephalus or extra-axial
fluid collection.

Vascular: No hyperdense vessel.

Skull: Normal. Negative for fracture or focal lesion.

Sinuses/Orbits: No acute finding.

Other: The mastoid air cells are well aerated.

CT CERVICAL SPINE FINDINGS

Alignment: Trace retrolisthesis C5 on C6.

Skull base and vertebrae: No acute fracture. No primary bone lesion
or focal pathologic process.

Soft tissues and spinal canal: No prevertebral fluid or swelling. No
visible canal hematoma.

Disc levels: Multilevel degenerative changes, with disc osteophyte
complexes at C4-C5, C5-C6, and C6-C7, with moderate spinal canal
stenosis at these levels. Multilevel uncovertebral and facet
arthropathy, which causes up to severe neural foraminal narrowing on
the left at C4-C5 and bilaterally at C5-C6 and C6-C7.

Upper chest: Emphysema. No focal pulmonary opacity or pleural
effusion.

Other: None.
IMPRESSION: 1.  No acute intracranial process.
2.  No acute fracture or traumatic listhesis in the cervical spine.

## 2023-02-07 ENCOUNTER — Encounter (HOSPITAL_COMMUNITY): Payer: Self-pay

## 2023-02-07 ENCOUNTER — Ambulatory Visit (HOSPITAL_COMMUNITY)
Admission: EM | Admit: 2023-02-07 | Discharge: 2023-02-07 | Disposition: A | Discharge status: Home / Self Care | Payer: Medicaid Other | Attending: Physician Assistant | Admitting: Physician Assistant

## 2023-02-07 ENCOUNTER — Ambulatory Visit (INDEPENDENT_AMBULATORY_CARE_PROVIDER_SITE_OTHER): Payer: Medicaid Other

## 2023-02-07 DIAGNOSIS — M25552 Pain in left hip: Secondary | ICD-10-CM | POA: Insufficient documentation

## 2023-02-07 LAB — POCT URINALYSIS DIP (MANUAL ENTRY)
Bilirubin, UA: NEGATIVE
Blood, UA: NEGATIVE
Glucose, UA: NEGATIVE mg/dL
Ketones, POC UA: NEGATIVE mg/dL
Leukocytes, UA: NEGATIVE
Nitrite, UA: NEGATIVE
Protein Ur, POC: NEGATIVE mg/dL
Spec Grav, UA: 1.02 (ref 1.010–1.025)
Urobilinogen, UA: 0.2 E.U./dL
pH, UA: 5.5 (ref 5.0–8.0)

## 2023-02-07 MED ORDER — METHOCARBAMOL 500 MG PO TABS: 500.0000 mg | ORAL_TABLET | Freq: Two times a day (BID) | ORAL | 0 refills | Status: AC

## 2023-02-07 MED ORDER — NAPROXEN 500 MG PO TABS: 500.0000 mg | ORAL_TABLET | Freq: Two times a day (BID) | ORAL | 0 refills | Status: AC

## 2023-02-07 NOTE — ED Provider Notes (Signed)
[facility]    CSN: [id number] Arrival date & time: 02/07/23  1629      History   Chief Complaint Chief Complaint  Patient presents with   Flank Pain    HPI Bill Greene is a 47 y.o. male.   Patient presents today with several concerns.  He reports that for the past year he has had intermittent left lower back pain that is radiating into his leg and causes a searing/burning sensation in his lower extremity.  This also radiates around into his left hip and towards his groin.  He denies any swelling or bulge in this area.  He is having normal bowel movements.  He reports that his pain has been worsening and he feels that there is something tight that needs to be popped in his groin.  He denies any urinary symptoms.  Denies any penile discharge.  Denies history of nephrolithiasis.  He denies any known injury increase activity prior to symptom onset.  Denies any bowel or bladder continence, lower extremity weakness, saddle anesthesia.  He has tried ibuprofen without improvement of symptoms.  Denies previous injury or spinal surgery.  Denies history of malignancy.  He is having difficulty with daily duties including sleeping at night as result of the pain.  Denies any associated fever, nausea, vomiting.    History reviewed. No pertinent past medical history.  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  Authorizing Provider  methocarbamol (ROBAXIN) 500 MG tablet Take 1 tablet (500 mg total) by mouth 2 (two) times daily. 02/07/23  Yes Yashvi Jasinski K, PA-C  naproxen (NAPROSYN) 500 MG tablet Take 1 tablet (500 mg total) by mouth 2 (two) times daily. 02/07/23  Yes Aaryn Parrilla, Noberto Retort, PA-C    Family History Family History  Problem Relation Age of Onset   Healthy Mother     Social History Social History   Tobacco Use   Smoking status: Every Day   Smokeless tobacco: Never  Substance Use Topics   Alcohol use: Yes    Comment: 5th   Drug use: Yes    Types: Cocaine     Allergies   Patient has no known allergies.   Review of Systems Review of Systems  Constitutional:  Positive for activity change. Negative for appetite change, fatigue and fever.  Gastrointestinal:  Negative for abdominal pain, diarrhea, nausea and vomiting.  Genitourinary:  Negative for dysuria, frequency, hematuria and urgency.  Musculoskeletal:  Positive for arthralgias, back pain and neck pain. Negative for gait problem, joint swelling and myalgias.  Neurological:  Negative for dizziness, light-headedness and headaches.     Physical Exam Triage Vital Signs ED Triage Vitals  Enc Vitals Group     BP 02/07/23 1718 118/76     Pulse Rate 02/07/23 1718 (!) 108     Resp 02/07/23 1718 16     Temp 02/07/23 1718 99 F (37.2 C)  Temp Source 02/07/23 1718 Oral     SpO2 02/07/23 1718 95 %     Weight 02/07/23 1717 189 lb (85.7 kg)     Height 02/07/23 1717 5\' 11"  (1.803 m)     Head Circumference --      Peak Flow --      Pain Score 02/07/23 1716 8     Pain Loc --      Pain Edu? --      Excl. in GC? --    No data found.  Updated Vital Signs BP 118/76 (BP Location: Left Arm)   Pulse 66   Temp 99 F (37.2 C) (Oral)   Resp 16   Ht 5\' 11"  (1.803 m)   Wt 189 lb (85.7 kg)   SpO2 94%   BMI 26.36 kg/m   Visual Acuity Right Eye Distance:   Left Eye Distance:   Bilateral Distance:    Right Eye Near:   Left  Eye Near:    Bilateral Near:     Physical Exam Vitals reviewed.  Constitutional:      General: He is awake.     Appearance: Normal appearance. He is well-developed. He is not ill-appearing.     Comments: Very pleasant male appears stated age in no acute distress sitting comfortably in exam room  HENT:     Head: Normocephalic and atraumatic.     Mouth/Throat:     Pharynx: Uvula midline. No oropharyngeal exudate or posterior oropharyngeal erythema.  Cardiovascular:     Rate and Rhythm: Normal rate and regular rhythm.     Heart sounds: Normal heart sounds, S1 normal and S2 normal. No murmur heard. Pulmonary:     Effort: Pulmonary effort is normal.     Breath sounds: Normal breath sounds. No stridor. No wheezing, rhonchi or rales.     Comments: Good auscultation bilaterally Abdominal:     General: Bowel sounds are normal.     Palpations: Abdomen is soft.     Tenderness: There is no abdominal tenderness. There is no right CVA tenderness, left CVA tenderness, guarding or rebound.     Hernia: There is no hernia in the left inguinal area or right inguinal area.     Comments: Benign abdominal exam  Genitourinary:    Penis: Normal and circumcised.      Epididymis:     Right: Normal. Not inflamed or enlarged.     Left: Normal. Not inflamed or enlarged.     Comments: Matt, CMA present as chaperone during exam. Musculoskeletal:     Cervical back: No tenderness or bony tenderness.     Thoracic back: No tenderness or bony tenderness.     Lumbar back: No tenderness or bony tenderness. Negative right straight leg raise test and negative left straight leg raise test.     Left hip: No deformity, tenderness or bony tenderness. Normal range of motion.     Comments: Back: No pain percussion of vertebrae.  No tenderness palpation of paraspinal muscles.  Negative straight leg raise bilaterally.  Strength 5/5 bilateral lower extremities.  Left hip: Normal active range of motion.  Tender to palpation  over anterior left hip with no deformity.  Strength 5/5.  Lymphadenopathy:     Lower Body: No right inguinal adenopathy. No left inguinal adenopathy.  Neurological:     Mental Status: He is alert.  Psychiatric:        Behavior: Behavior is cooperative.      UC Treatments / Results  Labs (  all labs ordered are listed, but only abnormal results are displayed) Labs Reviewed  POCT URINALYSIS DIP (MANUAL ENTRY)  CYTOLOGY, (ORAL, ANAL, URETHRAL) ANCILLARY ONLY    EKG   Radiology No results found.  Procedures Procedures (including critical care time)  Medications Ordered in UC Medications - No data to display  Initial Impression / Assessment and Plan / UC Course  I have reviewed the triage vital signs and the nursing notes.  Pertinent labs & imaging results that were available during my care of the patient were reviewed by me and considered in my medical decision making (see chart for details).     Patient was initially tachycardic on intake but I believe this was a mistake as he did not send tachycardic on exam and repeat vitals that showed a pulse rate of 66.  He is otherwise well-appearing, afebrile, nontoxic.  Unclear etiology of symptoms.  I suspect this is more musculoskeletal including clinical presentation.  Urine was normal with no evidence of infection or blood making nephrolithiasis less likely.  STI swab was collected and is pending but he did not have significant tenderness on testicular exam concerning for epididymitis or orchitis.  No identifiable hernia on exam.  X-ray was obtained and primary read shows no significant degeneration or acute osseous abnormality.  We are still waiting on final read from radiology and we will contact patient if this changes our treatment plan.  Will start Naprosyn twice daily.  We discussed that this should not be taken with additional NSAIDs due to risk of GI bleeding.  Can use Tylenol for breakthrough pain.  Recommend heat and gentle  stretch for additional symptom relief.  He was also given methocarbamol for pain relief.  We discussed that this can be sedating and he is not to drive or drink alcohol with taking it.  If his symptoms persist he has to follow-up with orthopedics for further evaluation and management.  He was given contact information for local provider with instruction to call to schedule an appointment.  We discussed that if he has any worsening or changing symptoms including increasing pain, swelling of this area, testicular discomfort, fever, nausea, vomiting, numbness or tingling he needs to be seen immediately.  Strict return precautions given.  Work excuse note declined by patient.  Final Clinical Impressions(s) / UC Diagnoses   Final diagnoses:  Left hip pain     Discharge Instructions      I believe that you are having hip pain.  Start Naprosyn twice daily.  Do not take NSAIDs with this medication including aspirin, ibuprofen/Advil, naproxen/Aleve.  Take methocarbamol up to twice a day.  This make you sleepy so do not drive or drink alcohol with taking it.  Use heat and gentle rest.  If you have any worsening symptoms including increasing pain, swelling in this area or in your testicle, fever, nausea, vomiting you need to be seen immediately.     ED Prescriptions     Medication Sig Dispense Auth. Provider   naproxen (NAPROSYN) 500 MG tablet Take 1 tablet (500 mg total) by mouth 2 (two) times daily. 30 tablet Teagon Kron K, PA-C   methocarbamol (ROBAXIN) 500 MG tablet Take 1 tablet (500 mg total) by mouth 2 (two) times daily. 20 tablet Taisa Deloria, Noberto Retort, PA-C      PDMP not reviewed this encounter.   Jeani Hawking, PA-C 02/07/23 1855

## 2023-02-07 NOTE — ED Triage Notes (Signed)
Patient here today with c/o left side flank pain X 1 year and right side neck pain/rash that tingles. Patient states that his neck pain has been going on for a year but the rash just started. He also stated that he has been having some left side groin pain that has been going on for a year as well. He feels likt his symptoms are worsening. His wife was thinking that he could have a UTI. Pain radiates down his left leg. IBU does not help.

## 2023-02-07 NOTE — Discharge Instructions (Addendum)
I believe that you are having hip pain.  Start Naprosyn twice daily.  Do not take NSAIDs with this medication including aspirin, ibuprofen/Advil, naproxen/Aleve.  Take methocarbamol up to twice a day.  This make you sleepy so do not drive or drink alcohol with taking it.  Use heat and gentle rest.  If you have any worsening symptoms including increasing pain, swelling in this area or in your testicle, fever, nausea, vomiting you need to be seen immediately.

## 2023-02-09 LAB — CYTOLOGY, (ORAL, ANAL, URETHRAL) ANCILLARY ONLY
Chlamydia: NEGATIVE
Comment: NEGATIVE
Comment: NEGATIVE
Comment: NORMAL
Neisseria Gonorrhea: NEGATIVE
Trichomonas: NEGATIVE

## 2023-05-19 ENCOUNTER — Ambulatory Visit (HOSPITAL_COMMUNITY)
Admission: EM | Admit: 2023-05-19 | Discharge: 2023-05-19 | Disposition: A | Discharge status: Home / Self Care | Payer: MEDICAID | Attending: Internal Medicine | Admitting: Internal Medicine

## 2023-05-19 ENCOUNTER — Encounter (HOSPITAL_COMMUNITY): Payer: Self-pay | Admitting: Emergency Medicine

## 2023-05-19 DIAGNOSIS — M545 Low back pain, unspecified: Secondary | ICD-10-CM | POA: Diagnosis not present

## 2023-05-19 DIAGNOSIS — K4090 Unilateral inguinal hernia, without obstruction or gangrene, not specified as recurrent: Secondary | ICD-10-CM

## 2023-05-19 MED ORDER — BACLOFEN 10 MG PO TABS: 10.0000 mg | ORAL_TABLET | Freq: Three times a day (TID) | ORAL | 0 refills | Status: AC

## 2023-05-19 MED ORDER — PREDNISONE 20 MG PO TABS: 40.0000 mg | ORAL_TABLET | Freq: Every day | ORAL | 0 refills | Status: AC

## 2023-05-19 NOTE — ED Triage Notes (Signed)
Pt reports has mid lower back pain that radiates around abd towards groin that has been going on for "a while". Around 3 months symptoms have been ongoing. Feels like a knot in left groin area. Reports has bowel movement or urinates "has feel good sensation but doesn't feel good". Reports has sensation to have a BM but can't.  Feels like a knot "where my anus and testicles meet".  Took Ibuprofen that didn't help.  Denies fall or injury to originally cause the pains,

## 2023-05-19 NOTE — Discharge Instructions (Addendum)
Your pain is likely due to a muscle strain which will improve on its own with time.  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 I would like for you to call the surgeon listed on paperwork to schedule an appointment for follow-up to discuss this further this is may need to be surgically repaired. Keep your poops soft by taking stool softener daily.  This will help with the pain associated with the hernia.  You may take prednisone 40 mg once daily for the next 5 days starting tomorrow.  Do not take any ibuprofen when taking this medicine. Apply heat to the low back. - You may also take the prescribed muscle relaxer as directed as needed for muscle aches/spasm.  Do not take this medication and drive or drink alcohol as it can make you sleepy.  Mainly use this medicine at nighttime as needed. - Apply heat 20 minutes on then 20 minutes off and perform gentle range of motion exercises to the area of greatest pain to prevent muscle stiffness and provide further pain relief.   Red flag symptoms to watch out for are numbness/tingling to the legs, weakness, loss of bowel/bladder control, and/or worsening pain that does not respond well to medicines. Follow-up with your primary care provider or return to urgent care if your symptoms do not improve in the next 3 to 4 days with medications and interventions recommended today. If your symptoms are severe (red flag), please go to the emergency room.

## 2023-05-19 NOTE — ED Provider Notes (Signed)
[facility]    CSN: [id number] Arrival date & time: 05/19/23  1649      History   Chief Complaint Chief Complaint  Patient presents with   Back Pain    HPI Bill Greene is a 47 y.o. male.   Patient presents to urgent care for evaluation of low back pain that starts to the lumbar spine and radiates to left lower quadrant abdomen, the "anal area and the scrotum", and the left leg intermittently.  States he feels a "knot" to the left groin region and is unsure if this is stool that he needs to pass.  Last normal bowel movement was yesterday and normal.  Pain to the low back is triggered by movement and currently 10 on a scale of 0-10.  Pain is a 3 out of 10 when he is at rest.  Described as a pulling pressure sensation.  No fall, trauma, numbness or tingling, saddle paresthesia, changes to bowel or urinary habits, extremity weakness, radicular symptoms.  He has been taking ibuprofen without much relief of pain.  States that heat to the low back improves pain significantly and heat to the "anal area" helped significantly as well.    Back Pain   History reviewed. No pertinent past medical history.  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 Authorizing Provider  methocarbamol (ROBAXIN) 500 MG tablet Take 1 tablet (500 mg total) by mouth 2 (two) times daily. 02/07/23   Raspet, Noberto Retort, PA-C  naproxen  (NAPROSYN) 500 MG tablet Take 1 tablet (500 mg total) by mouth 2 (two) times daily. 02/07/23   Raspet, Noberto Retort, PA-C    Family History Family History  Problem Relation Age of Onset   Healthy Mother     Social History Social History   Tobacco Use   Smoking status: Every Day   Smokeless tobacco: Never  Substance Use Topics   Alcohol use: Yes    Comment: 5th   Drug use: Yes    Types: Cocaine     Allergies   Patient has no known allergies.   Review of Systems Review of Systems  Musculoskeletal:  Positive for back pain.     Physical Exam Triage Vital Signs ED Triage Vitals  Encounter Vitals Group     BP 05/19/23 1724 108/77     Systolic BP Percentile --      Diastolic BP Percentile --      Pulse Rate 05/19/23 1724 63     Resp 05/19/23 1724 15     Temp 05/19/23 1724 98.2 F (36.8 C)     Temp Source 05/19/23 1724 Oral     SpO2 05/19/23 1724 98 %     Weight --      Height --      Head Circumference --      Peak Flow --      Pain Score 05/19/23 1719 10  Pain Loc --      Pain Education --      Exclude from Growth Chart --    No data found.  Updated Vital Signs BP 108/77 (BP Location: Left Arm)   Pulse 63   Temp 98.2 F (36.8 C) (Oral)   Resp 15   SpO2 98%   Visual Acuity Right Eye Distance:   Left Eye Distance:   Bilateral Distance:    Right Eye Near:   Left Eye Near:    Bilateral Near:     Physical Exam Vitals and nursing note reviewed. Exam conducted with a chaperone present Clerance Lav, Char[employer] fundraiser).  Constitutional:      Appearance: He is not ill-appearing or toxic-appearing.  HENT:     Head: Normocephalic and atraumatic.     Right Ear: Hearing and external ear normal.     Left Ear: Hearing and external ear normal.     Nose: Nose normal.     Mouth/Throat:     Lips: Pink.  Eyes:     General: Lids are normal. Vision grossly intact. Gaze aligned appropriately.     Extraocular Movements: Extraocular movements intact.     Conjunctiva/sclera:  Conjunctivae normal.  Cardiovascular:     Rate and Rhythm: Normal rate and regular rhythm.     Heart sounds: Normal heart sounds, S1 normal and S2 normal.  Pulmonary:     Effort: Pulmonary effort is normal. No respiratory distress.     Breath sounds: Normal breath sounds and air entry.  Abdominal:     General: Bowel sounds are normal.     Palpations: Abdomen is soft.     Tenderness: There is no abdominal tenderness. There is no right CVA tenderness, left CVA tenderness or guarding.     Hernia: A hernia is present. Hernia is present in the left inguinal area.  Genitourinary:    Penis: Normal.      Testes: Normal.     Epididymis:     Right: Normal.     Left: Normal.  Musculoskeletal:     Cervical back: Neck supple.  Lymphadenopathy:     Lower Body: No left inguinal adenopathy.  Skin:    General: Skin is warm and dry.     Capillary Refill: Capillary refill takes less than 2 seconds.     Findings: No rash.  Neurological:     General: No focal deficit present.     Mental Status: He is alert and oriented to person, place, and time. Mental status is at baseline.     Cranial Nerves: No dysarthria or facial asymmetry.  Psychiatric:        Mood and Affect: Mood normal.        Speech: Speech normal.        Behavior: Behavior normal.        Thought Content: Thought content normal.        Judgment: Judgment normal.      UC Treatments / Results  Labs (all labs ordered are listed, but only abnormal results are displayed) Labs Reviewed - No data to display  EKG   Radiology No results found.  Procedures Procedures (including critical care time)  Medications Ordered in UC Medications - No data to display  Initial Impression / Assessment and Plan / UC Course  I have reviewed the triage vital signs and the nursing notes.  Pertinent labs & imaging results that were available during my care of the patient were reviewed by me and considered in my medical  decision making (see  chart for details).   1.  Unilateral inguinal hernia without obstruction or gangrene, low back pain without sciatica Left inguinal hernia present on exam.  This is likely the reason for the "knot" that patient is describing to the left lower quadrant abdomen.  Advised to keep stools soft and avoid increased pressure to the abdominal area.  Advised to avoid heavy lifting and take stool softener to keep stool soft.  Walking referral to [facility] surgery provided for follow-up.  No indications for emergency department visit currently, however strict ER return precautions discussed.  2.  Acute bilateral low back pain without sciatica Evaluation suggests pain is musculoskeletal in nature. Will manage this with rest, gentle ROM exercises, heat therapy, prednisone burst 40 mg for 5 days starting tomorrow, and as needed use of muscle relaxer. Drowsiness precautions discussed regarding muscle relaxer use. Symptoms have not responded well to ibuprofen, therefore prednisone burst sent. Imaging: no indication for imaging based on stable musculoskeletal exam findings May follow-up with orthopedics as needed. Work/school excise note given.  Counseled patient on potential for adverse effects with medications prescribed/recommended today, strict ER and return-to-clinic precautions discussed, patient verbalized understanding.    Final Clinical Impressions(s) / UC Diagnoses   Final diagnoses:  Unilateral inguinal hernia without obstruction or gangrene, recurrence not specified  Acute bilateral low back pain without sciatica     Discharge Instructions      Your pain is likely due to a muscle strain which will improve on its own with time.  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 I would like for you to call the surgeon listed on paperwork to schedule an appointment for follow-up to discuss this further this is may need to be surgically  repaired. Keep your poops soft by taking stool softener daily.  This will help with the pain associated with the hernia.  You may take prednisone 40 mg once daily for the next 5 days starting tomorrow.  Do not take any ibuprofen when taking this medicine. Apply heat to the low back. - You may also take the prescribed muscle relaxer as directed as needed for muscle aches/spasm.  Do not take this medication and drive or drink alcohol as it can make you sleepy.  Mainly use this medicine at nighttime as needed. - Apply heat 20 minutes on then 20 minutes off and perform gentle range of motion exercises to the area of greatest pain to prevent muscle stiffness and provide further pain relief.   Red flag symptoms to watch out for are numbness/tingling to the legs, weakness, loss of bowel/bladder control, and/or worsening pain that does not respond well to medicines. Follow-up with your primary care provider or return to urgent care if your symptoms do not improve in the next 3 to 4 days with medications and interventions recommended today. If your symptoms are severe (red flag), please go to the emergency room.       ED Prescriptions   None    PDMP not reviewed this encounter.   Carlisle Beers, [place] 05/19/23 [phone]

## 2023-05-29 ENCOUNTER — Encounter (HOSPITAL_COMMUNITY): Payer: Self-pay

## 2023-05-29 ENCOUNTER — Ambulatory Visit (HOSPITAL_COMMUNITY): Payer: Self-pay | Admitting: Mental Health

## 2023-07-02 ENCOUNTER — Emergency Department (HOSPITAL_COMMUNITY): Payer: MEDICAID

## 2023-07-02 ENCOUNTER — Other Ambulatory Visit: Payer: Self-pay

## 2023-07-02 ENCOUNTER — Emergency Department (HOSPITAL_COMMUNITY)
Admission: EM | Admit: 2023-07-02 | Discharge: 2023-07-02 | Disposition: A | Discharge status: Home / Self Care | Payer: MEDICAID | Attending: Emergency Medicine | Admitting: Emergency Medicine

## 2023-07-02 ENCOUNTER — Encounter (HOSPITAL_COMMUNITY): Payer: Self-pay

## 2023-07-02 DIAGNOSIS — R109 Unspecified abdominal pain: Secondary | ICD-10-CM | POA: Insufficient documentation

## 2023-07-02 DIAGNOSIS — R300 Dysuria: Secondary | ICD-10-CM | POA: Insufficient documentation

## 2023-07-02 DIAGNOSIS — R102 Pelvic and perineal pain unspecified side: Secondary | ICD-10-CM

## 2023-07-02 DIAGNOSIS — K5900 Constipation, unspecified: Secondary | ICD-10-CM | POA: Diagnosis not present

## 2023-07-02 LAB — CBC
HCT: 44.6 % (ref 39.0–52.0)
Hemoglobin: 14.9 g/dL (ref 13.0–17.0)
MCH: 31.2 pg (ref 26.0–34.0)
MCHC: 33.4 g/dL (ref 30.0–36.0)
MCV: 93.5 fL (ref 80.0–100.0)
Platelets: 167 10*3/uL (ref 150–400)
RBC: 4.77 MIL/uL (ref 4.22–5.81)
RDW: 11.9 % (ref 11.5–15.5)
WBC: 8.8 10*3/uL (ref 4.0–10.5)
nRBC: 0 % (ref 0.0–0.2)

## 2023-07-02 LAB — COMPREHENSIVE METABOLIC PANEL
ALT: 20 U/L (ref 0–44)
AST: 23 U/L (ref 15–41)
Albumin: 3.2 g/dL — ABNORMAL LOW (ref 3.5–5.0)
Alkaline Phosphatase: 39 U/L (ref 38–126)
Anion gap: 5 (ref 5–15)
BUN: 11 mg/dL (ref 6–20)
CO2: 24 mmol/L (ref 22–32)
Calcium: 8.6 mg/dL — ABNORMAL LOW (ref 8.9–10.3)
Chloride: 108 mmol/L (ref 98–111)
Creatinine, Ser: 1.24 mg/dL (ref 0.61–1.24)
GFR, Estimated: >60 mL/min (ref >60)
Glucose, Bld: 113 mg/dL — ABNORMAL HIGH (ref 70–99)
Potassium: 3.4 mmol/L — ABNORMAL LOW (ref 3.5–5.1)
Sodium: 137 mmol/L (ref 135–145)
Total Bilirubin: 0.5 mg/dL (ref <1.2)
Total Protein: 5.1 g/dL — ABNORMAL LOW (ref 6.5–8.1)

## 2023-07-02 LAB — URINALYSIS, ROUTINE W REFLEX MICROSCOPIC
Bilirubin Urine: NEGATIVE
Glucose, UA: NEGATIVE mg/dL
Hgb urine dipstick: NEGATIVE
Ketones, ur: NEGATIVE mg/dL
Leukocytes,Ua: NEGATIVE
Nitrite: NEGATIVE
Protein, ur: NEGATIVE mg/dL
Specific Gravity, Urine: 1.018 (ref 1.005–1.030)
pH: 7 (ref 5.0–8.0)

## 2023-07-02 LAB — LIPASE, BLOOD: Lipase: 32 U/L (ref 11–51)

## 2023-07-02 MED ORDER — POLYETHYLENE GLYCOL 3350 17 G PO PACK: 17.0000 g | PACK | Freq: Every day | ORAL | 0 refills | Status: AC

## 2023-07-02 MED ORDER — IOHEXOL 350 MG/ML SOLN
75.0000 mL | Freq: Once | INTRAVENOUS | Status: AC | PRN
  Administered 2023-07-02: 75 mL via INTRAVENOUS

## 2023-07-02 MED ORDER — FENTANYL CITRATE PF 50 MCG/ML IJ SOSY
50.0000 ug | PREFILLED_SYRINGE | Freq: Once | INTRAMUSCULAR | Status: AC
  Administered 2023-07-02: 50 ug via INTRAVENOUS
  Filled 2023-07-02: qty 1

## 2023-07-02 MED ORDER — HYDROMORPHONE HCL 1 MG/ML IJ SOLN
1.0000 mg | Freq: Once | INTRAMUSCULAR | Status: AC
  Administered 2023-07-02: 1 mg via INTRAVENOUS
  Filled 2023-07-02: qty 1

## 2023-07-02 NOTE — ED Provider Notes (Addendum)
CT abdomen pelvis without any acute findings.  Patient's labs urinalysis normal.  Complete metabolic panel potassium down a little bit at 3.4 liver function test normal.  Renal function normal.  CBC no leukocytosis hemoglobin 14.9 platelets are 167.  Patient is requesting additional pain medicine he received fentanyl earlier.  Will give 1 dose of 1 mg of Dilaudid.  Patient should be stable for discharge home.   Vanetta Mulders, MD 07/02/23 1950  On my exam.  GU exam is normal.  No evidence of any STD.  No evidence of hernia and no groin adenopathy.  Perianal also normal.  No fissure no prolapsed internal hemorrhoids no external hemorrhoids.  Will have patient use MiraLAX at home follow-up with the wellness clinic.  If symptoms do not improve may require colonoscopy.    Vanetta Mulders, MD 07/02/23 2110

## 2023-07-02 NOTE — ED Provider Notes (Signed)
[facility] [facility] Provider Note   CSN: [id number] Arrival date & time: 07/02/23  1141     History  Chief Complaint  Patient presents with   Abdominal Pain    WALLACE ERHARD is a 47 y.o. male.  Patient is a 47 yo male presenting for perineal pain. Patient admits to pain in his perineum that radiates to his testicles, rectum, and bilateral lower back that has been occurring for a month but just now worsening to an unbearable state. Denies fevers or chills. Denies nausea or vomiting. Admits to dysuria and increased frequency. Denies hematuria. Able to urinate without difficulty. Admits to feelings of having to have a large bowel movement but only producing small bowel movements. No improvement with OTC laxatives.  Denies penile discharge or rashes. Denies scrotal swelling. Denies concerns for STDs.  Admits to bright red blood in stool. Denies family hx of colon cancer.   The history is provided by the patient. No language interpreter was used.  Abdominal Pain Associated symptoms: dysuria   Associated symptoms: no chest pain, no chills, no cough, no fever, no hematuria, no shortness of breath, no sore throat and no vomiting        Home Medications Prior to Admission medications   Medication Sig Start Date End Date Taking? Authorizing Provider  baclofen (LIORESAL) 10 MG tablet Take 1 tablet (10 mg total) by mouth 3 (three) times daily. 05/19/23   Carlisle Beers, FNP  methocarbamol (ROBAXIN) 500 MG tablet Take 1 tablet (500 mg total) by mouth 2 (two) times daily. 02/07/23   Raspet, Noberto Retort, PA-C  naproxen (NAPROSYN) 500 MG tablet Take 1 tablet (500 mg total) by mouth 2 (two) times daily. 02/07/23   Raspet, Noberto Retort, PA-C      Allergies    Patient has no known allergies.    Review of Systems   Review of Systems  Constitutional:  Negative for chills and fever.  HENT:  Negative for ear pain and sore throat.   Eyes:  Negative for pain and  visual disturbance.  Respiratory:  Negative for cough and shortness of breath.   Cardiovascular:  Negative for chest pain and palpitations.  Gastrointestinal:  Positive for abdominal pain and blood in stool. Negative for vomiting.  Genitourinary:  Positive for dysuria, frequency, penile pain and testicular pain. Negative for hematuria, penile swelling and scrotal swelling.  Musculoskeletal:  Positive for back pain. Negative for arthralgias.  Skin:  Negative for color change and rash.  Neurological:  Negative for seizures and syncope.  All other systems reviewed and are negative.   Physical Exam Updated Vital Signs BP 124/81   Pulse 86   Temp 98.3 F (36.8 C)   Resp 16   Ht 5\' 11"  (1.803 m)   Wt 86 kg   SpO2 98%   BMI 26.44 kg/m  Physical Exam Vitals and nursing note reviewed.  Constitutional:      General: He is not in acute distress.    Appearance: He is well-developed.  HENT:     Head: Normocephalic and atraumatic.  Eyes:     Conjunctiva/sclera: Conjunctivae normal.  Cardiovascular:     Rate and Rhythm: Normal rate and regular rhythm.     Heart sounds: No murmur heard. Pulmonary:     Effort: Pulmonary effort is normal. No respiratory distress.     Breath sounds: Normal breath sounds.  Abdominal:     Palpations: Abdomen is soft.  Tenderness: There is no abdominal tenderness.  Genitourinary:    Comments: Pending GU exam Musculoskeletal:        General: No swelling.     Cervical back: Neck supple.  Skin:    General: Skin is warm and dry.     Capillary Refill: Capillary refill takes less than 2 seconds.  Neurological:     Mental Status: He is alert.  Psychiatric:        Mood and Affect: Mood normal.     ED Results / Procedures / Treatments   Labs (all labs ordered are listed, but only abnormal results are displayed) Labs Reviewed  COMPREHENSIVE METABOLIC PANEL - Abnormal; Notable for the following components:      Result Value   Potassium 3.4 (*)     Glucose, Bld 113 (*)    Calcium 8.6 (*)    Total Protein 5.1 (*)    Albumin 3.2 (*)    All other components within normal limits  LIPASE, BLOOD  CBC  URINALYSIS, ROUTINE W REFLEX MICROSCOPIC    EKG None  Radiology No results found.  Procedures Procedures    Medications Ordered in ED Medications - No data to display  ED Course/ Medical Decision Making/ A&P                                 Medical Decision Making Amount and/or Complexity of Data Reviewed Labs: ordered. Radiology: ordered.  Risk OTC drugs. Prescription drug management.   47 yo male presenting for perineal pain. Patient admits to pain in his perineum that radiates to his testicles, rectum, and bilateral lower back that has been occurring for a month but just now worsening to an unbearable state.  Arrives alert and oriented x 3, no acute distress, afebrile, stable vital signs.  He has pleasant demeanor.  Upon entering the room the patient is fully dressed.  He is recommended to get into a gown and remove his undergarments so that I can perform a proper GU exam due to testicle, perineal, and rectal pain.  Laboratory studies are pending.  CT abdomen and pelvis ordered.  Upon arriving back to the room the patient is down at CT scan.  Currently it is shift change.  Patient signed out to oncoming provider.  He is aware that the GU exam has not been completed at this time and still needs to be completed.        Final Clinical Impression(s) / ED Diagnoses Final diagnoses:  None    Rx / DC Orders ED Discharge Orders     None         Franne Forts, DO 07/03/23 [phone]

## 2023-07-02 NOTE — ED Triage Notes (Addendum)
Pt c/o abdominal pain, lower back pain, constipation, and burning with urination that has been intermittent for several months. Pt c/o left sided burning pain radiating into left leg. Pt states he has also had neck and shoulder pain. Pt states his urine is dark. Pt states he feels tired and like his body is shutting down.

## 2023-07-02 NOTE — ED Notes (Signed)
Pt ambulated to bathroom without assistance 

## 2023-07-02 NOTE — ED Notes (Signed)
Pt very uncomfortable in his pelvic area and is having a hard time sitting still.

## 2023-07-02 NOTE — Discharge Instructions (Signed)
Schedule appointment to follow-up with the wellness clinic.  If symptoms persist she may need colonoscopy.  Recommend using MiraLAX twice a day until you start having good bowel movements then drop down to once a day.  Today's workup without any significant findings at all.  Did show evidence of constipation however.  But nothing acute or worrisome.  For the pain you can take extra strength Tylenol 2 tablets every 8 hours.  Can also take Motrin 800 mg every 8.

## 2023-09-19 ENCOUNTER — Ambulatory Visit (HOSPITAL_COMMUNITY)
Admission: EM | Admit: 2023-09-19 | Discharge: 2023-09-19 | Disposition: A | Discharge status: Home / Self Care | Payer: MEDICAID | Attending: Physician Assistant | Admitting: Physician Assistant

## 2023-09-19 ENCOUNTER — Encounter (HOSPITAL_COMMUNITY): Payer: Self-pay

## 2023-09-19 DIAGNOSIS — J069 Acute upper respiratory infection, unspecified: Secondary | ICD-10-CM

## 2023-09-19 DIAGNOSIS — K4030 Unilateral inguinal hernia, with obstruction, without gangrene, not specified as recurrent: Secondary | ICD-10-CM | POA: Diagnosis not present

## 2023-09-19 LAB — POCT URINALYSIS DIP (MANUAL ENTRY)
Blood, UA: NEGATIVE
Glucose, UA: NEGATIVE mg/dL
Leukocytes, UA: NEGATIVE
Nitrite, UA: NEGATIVE
Protein Ur, POC: 100 mg/dL — AB
Spec Grav, UA: >=1.03 — AB (ref 1.010–1.025)
Urobilinogen, UA: 4 U/dL — AB
pH, UA: 6 (ref 5.0–8.0)

## 2023-09-19 LAB — POCT MONO SCREEN (KUC): Mono, POC: NEGATIVE

## 2023-09-19 NOTE — ED Notes (Signed)
 Patient is being discharged from the Urgent Care and sent to the Emergency Department via private vehicle . Per E. Mecum NP, patient is in need of higher level of care due to abdominal pain. Patient is aware and verbalizes understanding of plan of care.  Vitals:   09/19/23 1558  BP: 115/75  Pulse: 85  Resp: 16  Temp: 99.4 F (37.4 C)  SpO2: 92%

## 2023-09-19 NOTE — Discharge Instructions (Addendum)
 Your urine testing was negative for signs of infection at this time.  At this time I recommend that you go to the emergency room.  You have expressed concerns for pain in your lower abdomen as well as inability to have a bowel movement.  This coupled with the fact that you have a hernia in your left lower quadrant can indicate a strangulated or incarcerated hernia.  You will need imaging from the emergency room to rule out further complications. We will keep you updated on the results of your urine testing and mono testing.

## 2023-09-19 NOTE — ED Provider Notes (Signed)
 [facility]    CSN: [id number] Arrival date & time: 09/19/23  1516      History   Chief Complaint Chief Complaint  Patient presents with   Cough    HPI Bill Greene is a 48 y.o. male.   HPI   He states he is having productive coughing, body aches, headaches, fever, chills for the past 3 days He states he has not had a bowel movement in 2 days He denies previous hx of asthma or breathing issues  He also reports dysuria and flank pain   He reports suprapubic pain and testicle swelling have been an ongoing issue for some time, prior to getting sick  He has been seen in ED and UC several times for this- has been diagnosed with unilateral inguinal hernia previously  He reports he has taken a laxative to help with the constipation but has not been able to have bowel movement. He reports he typically has one every day and feels like he needs to make a bowel movement but cannot He reports heavy, pulling sensation in left inguinal area  He states there is tight sensation along his waist even when wearing loose clothing He also reports that he is having left-sided low back pain.  He states that typically when he has a bowel movement the pain is relieved slightly.   History reviewed. No pertinent past medical history.  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 Authorizing Provider  baclofen  (LIORESAL )  10 MG tablet Take 1 tablet (10 mg total) by mouth 3 (three) times daily. 05/19/23   Enedelia Dorna HERO, FNP  methocarbamol  (ROBAXIN ) 500 MG tablet Take 1 tablet (500 mg total) by mouth 2 (two) times daily. 02/07/23   Raspet, Khanh Tanori K, PA-C  naproxen  (NAPROSYN ) 500 MG tablet Take 1 tablet (500 mg total) by mouth 2 (two) times daily. 02/07/23   Raspet, Vittoria Noreen K, PA-C  polyethylene glycol (MIRALAX ) 17 g packet Take 17 g by mouth daily. 07/02/23   Zackowski, Scott, MD    Family History Family History  Problem Relation Age of Onset   Healthy Mother     Social History Social History   Tobacco Use   Smoking status: Every Day   Smokeless tobacco: Never  Vaping Use   Vaping status: Never Used  Substance Use Topics   Alcohol use: Yes    Comment: 5th   Drug use: Yes    Types: Cocaine     Allergies   Patient has no known allergies.   Review of Systems Review of Systems  Constitutional:  Positive for chills, diaphoresis, fatigue and fever.  HENT:  Positive for rhinorrhea. Negative for congestion, ear pain and sore throat.   Respiratory:  Positive for cough and wheezing. Negative for shortness of breath.   Gastrointestinal:  Positive for abdominal pain (reports suprapubic pain and flank pain) and constipation. Negative for nausea and vomiting.  Genitourinary:  Positive for dysuria, flank pain and testicular pain. Negative for hematuria, penile discharge, penile pain and penile swelling.  Musculoskeletal:  Positive for myalgias.  Neurological:  Positive for headaches.     Physical Exam Triage Vital Signs ED Triage Vitals  Encounter Vitals Group     BP 09/19/23 1558 115/75     Systolic BP Percentile --      Diastolic BP Percentile --      Pulse Rate 09/19/23 1558 85     Resp 09/19/23 1558 16     Temp 09/19/23 1558 99.4 F (37.4 C)     Temp Source 09/19/23 1558 Oral     SpO2 09/19/23 1558 92 %     Weight --      Height --      Head Circumference --      Peak Flow --      Pain  Score 09/19/23 1557 0     Pain Loc --      Pain Education --      Exclude from Growth Chart --    No data found.  Updated Vital Signs BP 115/75 (BP Location: Left Arm)   Pulse 85   Temp 99.4 F (37.4 C) (Oral)   Resp 16   SpO2 92%   Visual Acuity Right Eye Distance:   Left Eye Distance:   Bilateral Distance:    Right Eye Near:   Left Eye Near:    Bilateral Near:     Physical Exam Vitals reviewed.  Constitutional:      General: He is awake.     Appearance: He is well-developed and well-groomed. He is ill-appearing and diaphoretic.  HENT:     Head: Normocephalic and atraumatic.  Cardiovascular:     Rate and Rhythm: Normal rate and regular rhythm.     Pulses: Normal pulses.     Heart sounds: Normal heart sounds.  Pulmonary:     Effort: Pulmonary effort is normal.     Breath sounds: No stridor or decreased air movement. No decreased breath sounds, wheezing, rhonchi or rales.  Abdominal:     General: Abdomen is flat. Bowel sounds are normal.     Palpations: Abdomen is soft.     Tenderness: There is abdominal tenderness in the suprapubic area.     Hernia: A hernia is present. Hernia is present in the left inguinal area.  Genitourinary:    Pubic Area: No rash or pubic lice.      Testes: Normal.        Right: Mass, tenderness, testicular hydrocele or varicocele not present.        Left: Mass, tenderness, testicular hydrocele or varicocele not present.     Comments: Patient declines chaperone   Musculoskeletal:     Cervical back: Normal range of motion and neck supple.  Skin:    General: Skin is warm.  Neurological:     General: No focal deficit present.     Mental Status: He is alert and oriented to person, place, and time.  Psychiatric:        Mood and Affect: Mood normal.        Behavior: Behavior normal. Behavior is cooperative.        Thought Content: Thought content normal.        Judgment: Judgment normal.      UC Treatments / Results  Labs (all labs  ordered are listed, but only abnormal results are displayed) Labs Reviewed  POCT URINALYSIS DIP (MANUAL  ENTRY) - Abnormal; Notable for the following components:      Result Value   Bilirubin, UA small (*)    Ketones, POC UA small (15) (*)    Spec Grav, UA >=1.030 (*)    Protein Ur, POC =100 (*)    Urobilinogen, UA 4.0 (*)    All other components within normal limits  POCT MONO SCREEN ([facility]) - Normal    EKG   Radiology No results found.  Procedures Procedures (including critical care time)  Medications Ordered in UC Medications - No data to display  Initial Impression / Assessment and Plan / UC Course  I have reviewed the triage vital signs and the nursing notes.  Pertinent labs & imaging results that were available during my care of the patient were reviewed by me and considered in my medical decision making (see chart for details).    Patient reports that he has had ongoing coughing, body aches, headaches, fever for the past 3 days.  He also states that he is having suprapubic pain, dysuria, flank pain as well.  I am unsure if this is new versus exacerbated by coughing.  He does have a history of left inguinal hernia per chart review.  At this time I am concerned for incarceration given the fact that he has not been able to have a bowel movement for the past 2 days despite laxative use.  Cannot rule out UTI or kidney stone at this time.  Will get urine dip for further evaluation.  Given his symptoms and HPI I will also get mono testing.  Point-of-care flu and COVID testing is not available at this time and he is outside the window for antivirals so these would not add to workup.  Physical exam is notable for left inguinal hernia.  Unable to feel hernia defect or reduce at this time.  There does not appear to be testicular swelling or erythema at this time.   Final Clinical Impressions(s) / UC Diagnoses   Final diagnoses:  Inguinal hernia with irreducibility  Upper respiratory  tract infection, unspecified type   Acute, new concern Patient reports that he has had symptoms comprised of ongoing productive coughing, fevers, chills for the past 3 days.  He states that he has been taking over-the-counter medications for this but it does not seem be responsive.   At this time I suspect likely viral URI or the flu.  We do not have access to his point-of-care testing for the flu or COVID in the clinic today but as he is outside antiviral window for flu workup would not be changed at this time. During conversation patient also states that he is having abdominal pain, constipation that is not responding to laxatives as well as heaviness in groin area.  He does have history of unilateral left-sided inguinal hernia.  He also reports discomfort with urination.  Urine dip was negative for blood, leukocytes, nitrites which lowers my suspicion for nephrolithiasis or UTI.  Mono testing was negative.  Physical exam was reassuring with regards to testicles.  There is concern for a reducible left-sided inguinal hernia.  Given the fact that he has not been able to have a bowel movement for the past 2 days I am concerned for obstruction. With  mildly elevated temperature, reduced oxygen saturation I am unsure if his current symptoms are secondary to strangulated hernia versus irreducible hernia with URI. Reviewed that it is recommended he goes to the emergency room for CT scan and further evaluation/management.  Patient is in agreement with plan and agrees to go via private vehicle.      Discharge Instructions      Your urine testing was negative for signs of infection at this time.  At this time I recommend that you go to the emergency room.  You have expressed concerns for pain in your lower abdomen as well as inability to have a bowel movement.  This coupled with the fact that you have a hernia in your left lower quadrant can indicate a strangulated or incarcerated hernia.  You will need imaging  from the emergency room to rule out further complications. We will keep you updated on the results of your urine testing and mono testing.     ED Prescriptions   None    PDMP not reviewed this encounter.   Marylene Rocky BRAVO, PA-C 09/19/23 [id number]

## 2023-09-19 NOTE — ED Triage Notes (Signed)
 Pt states cough,chills,body aches for the past 3 days.  States he has been taking OTC cold medicine and Tylenol .

## 2024-02-04 ENCOUNTER — Emergency Department (HOSPITAL_COMMUNITY): Payer: MEDICAID

## 2024-02-04 ENCOUNTER — Emergency Department (HOSPITAL_COMMUNITY)
Admission: EM | Admit: 2024-02-04 | Discharge: 2024-02-04 | Disposition: A | Discharge status: Home / Self Care | Payer: MEDICAID | Attending: Emergency Medicine | Admitting: Emergency Medicine

## 2024-02-04 ENCOUNTER — Other Ambulatory Visit: Payer: Self-pay

## 2024-02-04 ENCOUNTER — Encounter (HOSPITAL_COMMUNITY): Payer: Self-pay

## 2024-02-04 DIAGNOSIS — F419 Anxiety disorder, unspecified: Secondary | ICD-10-CM | POA: Insufficient documentation

## 2024-02-04 DIAGNOSIS — J069 Acute upper respiratory infection, unspecified: Secondary | ICD-10-CM | POA: Insufficient documentation

## 2024-02-04 DIAGNOSIS — R1030 Lower abdominal pain, unspecified: Secondary | ICD-10-CM | POA: Diagnosis not present

## 2024-02-04 DIAGNOSIS — R7989 Other specified abnormal findings of blood chemistry: Secondary | ICD-10-CM | POA: Insufficient documentation

## 2024-02-04 DIAGNOSIS — R059 Cough, unspecified: Secondary | ICD-10-CM | POA: Diagnosis present

## 2024-02-04 LAB — I-STAT CHEM 8, ED
BUN: 9 mg/dL (ref 6–20)
Calcium, Ion: 1.14 mmol/L — ABNORMAL LOW (ref 1.15–1.40)
Chloride: 107 mmol/L (ref 98–111)
Creatinine, Ser: 1.3 mg/dL — ABNORMAL HIGH (ref 0.61–1.24)
Glucose, Bld: 92 mg/dL (ref 70–99)
HCT: 44 % (ref 39.0–52.0)
Hemoglobin: 15 g/dL (ref 13.0–17.0)
Potassium: 3.8 mmol/L (ref 3.5–5.1)
Sodium: 139 mmol/L (ref 135–145)
TCO2: 20 mmol/L — ABNORMAL LOW (ref 22–32)

## 2024-02-04 LAB — CBC WITH DIFFERENTIAL/PLATELET
Abs Immature Granulocytes: 0 10*3/uL (ref 0.00–0.07)
Basophils Absolute: 0 10*3/uL (ref 0.0–0.1)
Basophils Relative: 0 %
Eosinophils Absolute: 0.3 10*3/uL (ref 0.0–0.5)
Eosinophils Relative: 3 %
HCT: 44.2 % (ref 39.0–52.0)
Hemoglobin: 15 g/dL (ref 13.0–17.0)
Lymphocytes Relative: 15 %
Lymphs Abs: 1.5 10*3/uL (ref 0.7–4.0)
MCH: 31.6 pg (ref 26.0–34.0)
MCHC: 33.9 g/dL (ref 30.0–36.0)
MCV: 93.1 fL (ref 80.0–100.0)
Monocytes Absolute: 1 10*3/uL (ref 0.1–1.0)
Monocytes Relative: 10 %
Neutro Abs: 7.1 10*3/uL (ref 1.7–7.7)
Neutrophils Relative %: 72 %
Platelets: 199 10*3/uL (ref 150–400)
RBC: 4.75 MIL/uL (ref 4.22–5.81)
RDW: 12.2 % (ref 11.5–15.5)
WBC: 9.9 10*3/uL (ref 4.0–10.5)
nRBC: 0 % (ref 0.0–0.2)
nRBC: 0 /100{WBCs}

## 2024-02-04 LAB — COMPREHENSIVE METABOLIC PANEL WITH GFR
ALT: 22 U/L (ref 0–44)
AST: 24 U/L (ref 15–41)
Albumin: 3.4 g/dL — ABNORMAL LOW (ref 3.5–5.0)
Alkaline Phosphatase: 50 U/L (ref 38–126)
Anion gap: 10 (ref 5–15)
BUN: 10 mg/dL (ref 6–20)
CO2: 19 mmol/L — ABNORMAL LOW (ref 22–32)
Calcium: 8.7 mg/dL — ABNORMAL LOW (ref 8.9–10.3)
Chloride: 109 mmol/L (ref 98–111)
Creatinine, Ser: 1.28 mg/dL — ABNORMAL HIGH (ref 0.61–1.24)
GFR, Estimated: >60 mL/min (ref >60)
Glucose, Bld: 92 mg/dL (ref 70–99)
Potassium: 3.8 mmol/L (ref 3.5–5.1)
Sodium: 138 mmol/L (ref 135–145)
Total Bilirubin: 0.3 mg/dL (ref 0.0–1.2)
Total Protein: 6.1 g/dL — ABNORMAL LOW (ref 6.5–8.1)

## 2024-02-04 LAB — RESP PANEL BY RT-PCR (RSV, FLU A&B, COVID)  RVPGX2
Influenza A by PCR: NEGATIVE
Influenza B by PCR: NEGATIVE
Resp Syncytial Virus by PCR: NEGATIVE
SARS Coronavirus 2 by RT PCR: NEGATIVE

## 2024-02-04 LAB — URINALYSIS, ROUTINE W REFLEX MICROSCOPIC
Bilirubin Urine: NEGATIVE
Glucose, UA: NEGATIVE mg/dL
Hgb urine dipstick: NEGATIVE
Ketones, ur: 5 mg/dL — AB
Leukocytes,Ua: NEGATIVE
Nitrite: NEGATIVE
Protein, ur: NEGATIVE mg/dL
Specific Gravity, Urine: >1.046 — ABNORMAL HIGH (ref 1.005–1.030)
pH: 5 (ref 5.0–8.0)

## 2024-02-04 LAB — CK: Total CK: 416 U/L — ABNORMAL HIGH (ref 49–397)

## 2024-02-04 MED ORDER — LACTATED RINGERS IV BOLUS
1000.0000 mL | Freq: Once | INTRAVENOUS | Status: AC
  Administered 2024-02-04: 1000 mL via INTRAVENOUS

## 2024-02-04 MED ORDER — IOHEXOL 350 MG/ML SOLN
75.0000 mL | Freq: Once | INTRAVENOUS | Status: AC | PRN
  Administered 2024-02-04: 75 mL via INTRAVENOUS

## 2024-02-04 MED ORDER — IBUPROFEN 400 MG PO TABS
400.0000 mg | ORAL_TABLET | Freq: Once | ORAL | Status: AC
  Administered 2024-02-04: 400 mg via ORAL
  Filled 2024-02-04: qty 1

## 2024-02-04 MED ORDER — ACETAMINOPHEN 500 MG PO TABS
1000.0000 mg | ORAL_TABLET | Freq: Once | ORAL | Status: AC
  Administered 2024-02-04: 1000 mg via ORAL
  Filled 2024-02-04: qty 2

## 2024-02-04 NOTE — ED Triage Notes (Signed)
 Pt states he has had cold sx and every time he coughs he has right pelvic pain/ back pain. Heat exposure at the beginning of the week.

## 2024-02-04 NOTE — ED Provider Notes (Signed)
 [facility] Provider Note   CSN: [id number] Arrival date & time: 02/04/24  1203     History Chief Complaint  Patient presents with   Abdominal Pain    Bill Greene is a 48 y.o. male w/ PMHx polysubstance use, anxiety, alcohol use who presents to the ED for evaluation of multiple complaints.  Patient states he has had cough congestion runny nose for the past few days.  He states somebody at work was also sick.  He states the symptoms were worse after he was exposed to dust.  He also states it is been very hot at work.  He also states he has ongoing lower abdominal/pelvic pain for multiple months.  He states his pelvis seems to only feel better if he rubs it.  He states he does not feel like he is having regular bowel movements.  He states he has intermittent low back pain.  He reports intermittent burning with urination.  He denies any discharge.  He denies any testicular swelling or focal testicular pain.  He is very concerned about an inguinal hernia.    Physical Exam Updated Vital Signs BP 133/69   Pulse 65   Temp 98.7 F (37.1 C)   Resp 18   Ht 5' 11 (1.803 m)   Wt 88.9 kg   SpO2 100%   BMI 27.34 kg/m  Physical Exam Vitals and nursing note reviewed.  Constitutional:      General: He is not in acute distress.    Appearance: He is well-developed. He is not ill-appearing.  HENT:     Head: Normocephalic and atraumatic.   Eyes:     Conjunctiva/sclera: Conjunctivae normal.    Cardiovascular:     Rate and Rhythm: Normal rate and regular rhythm.     Heart sounds: Normal heart sounds. No murmur heard. Pulmonary:     Effort: Pulmonary effort is normal. No respiratory distress.     Breath sounds: Normal breath sounds.  Abdominal:     General: Abdomen is flat.     Palpations: Abdomen is soft.     Tenderness: There is no abdominal tenderness. There is no guarding or rebound.  Genitourinary:    Penis: Normal.      Testes:  Cremasteric reflex is present.        Right: Mass, tenderness or swelling not present.        Left: Mass, tenderness or swelling not present.     Comments: Possible very small left inguinal hernia.  Musculoskeletal:        General: No swelling.     Cervical back: Neck supple.   Skin:    General: Skin is warm and dry.     Capillary Refill: Capillary refill takes less than 2 seconds.   Neurological:     Mental Status: He is alert.   Psychiatric:        Mood and Affect: Mood is anxious.     ED Results / Procedures / Treatments   Labs (all labs ordered are listed, but only abnormal results are displayed) Labs Reviewed  COMPREHENSIVE METABOLIC PANEL WITH GFR - Abnormal; Notable for the following components:      Result Value   CO2 19 (*)    Creatinine, Ser 1.28 (*)    Calcium 8.7 (*)    Total Protein 6.1 (*)    Albumin 3.4 (*)    All other components within normal limits  URINALYSIS, ROUTINE W REFLEX MICROSCOPIC - Abnormal;  Notable for the following components:   APPearance HAZY (*)    Specific Gravity, Urine >1.046 (*)    Ketones, ur 5 (*)    All other components within normal limits  CK - Abnormal; Notable for the following components:   Total CK 416 (*)    All other components within normal limits  I-STAT CHEM 8, ED - Abnormal; Notable for the following components:   Creatinine, Ser 1.30 (*)    Calcium, Ion 1.14 (*)    TCO2 20 (*)    All other components within normal limits  RESP PANEL BY RT-PCR (RSV, FLU A&B, COVID)  RVPGX2  CBC WITH DIFFERENTIAL/PLATELET  GC/CHLAMYDIA PROBE AMP ([place]) NOT AT [facility]    EKG None  Radiology CT ABDOMEN PELVIS W CONTRAST Result Date: 02/04/2024 CLINICAL DATA:  Acute abdominal pain EXAM: CT ABDOMEN AND PELVIS WITH CONTRAST TECHNIQUE: Multidetector CT imaging of the abdomen and pelvis was performed using the standard protocol following bolus administration of intravenous contrast. RADIATION DOSE REDUCTION: This exam was performed  according to the departmental dose-optimization program which includes automated exposure control, adjustment of the mA and/or kV according to patient size and/or use of iterative reconstruction technique. CONTRAST:  75mL OMNIPAQUE  IOHEXOL  350 MG/ML SOLN COMPARISON:  CT abdomen and pelvis 07/02/2023 FINDINGS: Lower chest: No acute abnormality. Hepatobiliary: No focal liver abnormality is seen. No gallstones, gallbladder wall thickening, or biliary dilatation. Pancreas: Unremarkable. No pancreatic ductal dilatation or surrounding inflammatory changes. Spleen: Normal in size without focal abnormality. Adrenals/Urinary Tract: Adrenal glands are unremarkable. Kidneys are normal, without renal calculi, focal lesion, or hydronephrosis. Bladder is unremarkable. Stomach/Bowel: There is wall thickening of the descending colon versus normal under distension. No focal inflammatory stranding seen. The appendix is normal. There is no bowel obstruction, pneumatosis or free air. Small bowel and stomach are within normal limits. Vascular/Lymphatic: No significant vascular findings are present. No enlarged abdominal or pelvic lymph nodes. Reproductive: Prostate is unremarkable. Other: There is a small fat containing umbilical hernia. No ascites. Musculoskeletal: Degenerative changes at L4-L5 are unchanged. IMPRESSION: 1. Wall thickening of the descending colon versus normal under distension. Correlate clinically for colitis. 2. No bowel obstruction. Normal appendix. 3. Small fat containing umbilical hernia. Electronically Signed   By: Greig Pique M.D.   On: 02/04/2024 21:34   US  SCROTUM W/DOPPLER Result Date: 02/04/2024 CLINICAL DATA:  Flank/pelvic pain that radiates into the testicles times several days. EXAM: SCROTAL ULTRASOUND DOPPLER ULTRASOUND OF THE TESTICLES TECHNIQUE: Complete ultrasound examination of the testicles, epididymis, and other scrotal structures was performed. Color and spectral Doppler ultrasound were also  utilized to evaluate blood flow to the testicles. COMPARISON:  None Available. FINDINGS: Right testicle Measurements: 4.9 cm x 2.6 cm x 2.9 cm. No mass or microlithiasis visualized. Left testicle Measurements: 4.9 cm x 2.7 cm x 3.0 cm. No mass or microlithiasis visualized. Right epididymis: Multiple right epididymal cysts are seen. The largest measures 0.4 cm x 0.5 cm x 0.5 cm. Left epididymis: Multiple left epididymal cysts are seen. The largest measures 0.4 cm x 0.4 cm x 0.4 cm. Hydrocele: A small to moderate sized (approximately 2.8 cm x 0.8 cm x 2.0 cm) right-sided hydrocele is seen. A small to moderate sized (approximately 2.8 cm x 0.5 cm x 1.8 cm) left-sided hydrocele is also noted. Varicocele:  None visualized. Pulsed Doppler interrogation of both testes demonstrates normal low resistance arterial and venous waveforms bilaterally. IMPRESSION: 1. Multiple small bilateral epididymal cysts. 2. Bilateral hydroceles. Electronically Signed  By: Suzen Dials M.D.   On: 02/04/2024 14:19   DG Chest 2 View Result Date: 02/04/2024 CLINICAL DATA:  Chest pain. EXAM: CHEST - 2 VIEW COMPARISON:  None Available. FINDINGS: The heart size and mediastinal contours are within normal limits. There is no evidence of acute infiltrate, pleural effusion or pneumothorax. The visualized skeletal structures are unremarkable. IMPRESSION: No active cardiopulmonary disease. Electronically Signed   By: Suzen Dials M.D.   On: 02/04/2024 13:36    Medications Ordered in ED Medications  lactated ringers  bolus 1,000 mL (0 mLs Intravenous Stopped 02/04/24 2231)  acetaminophen  (TYLENOL ) tablet 1,000 mg (1,000 mg Oral Given 02/04/24 2129)  ibuprofen  (ADVIL ) tablet 400 mg (400 mg Oral Given 02/04/24 2129)  iohexol  (OMNIPAQUE ) 350 MG/ML injection 75 mL (75 mLs Intravenous Contrast Given 02/04/24 2101)    ED Course/ Medical Decision Making/ A&P  Bill Greene is a 48 y.o. male presents as detailed above  Differential ddx:  Inguinal hernia, epididymitis, constipation, anxiety, dehydration, viral URI, COVID flu, UTI, STI, STD,  On arrival, patient afebrile hemodynamically stable no hypoxia or respiratory distress.  ED Work-up: Please see details of labs and imaging listed above. COVID flu negative CK elevated 416 patient received liter fluid bolus. Creatinine elevated 1.3 but this is patient's baseline.  No other significant electrolyte abnormalities No significant abnormality on CBC. Ultrasound of scrotum reveals epididymal cyst and hydrocele otherwise no acute abnormality.  Unlikely these are causing patient's symptoms. Chest x-ray without acute cardiopulmonary abnormality. CT abdomen pelvis possible inflammation of colon and small umbilical hernia.  No inguinal hernia. Urinalysis without signs of infection. Etiology of symptoms unclear at this time however no evidence of acute life-threatening illness or etiology.  Will refer patient to urology and family medicine for further evaluation and treatment.  Patient voiced understanding agrees with plan     Overall impression viral URI, nonspecific pelvic pain  Patient stable for discharge and outpatient follow-up. Strict return precautions provided. Patient voices understanding and agrees with plan.   Patient seen with supervising physician who agrees with plan.  Final Clinical Impression(s) / ED Diagnoses Final diagnoses:  Lower abdominal pain  Viral URI    Rx / DC Orders ED Discharge Orders          Ordered    Ambulatory referral to Urology        02/04/24 2232            Waddell Seats, DO PGY-3 Emergency Medicine    Seats Waddell, DO 02/04/24 2249    Francesca Elsie LITTIE, MD 02/04/24 2314

## 2024-02-04 NOTE — Discharge Instructions (Addendum)
 Urology and family medicine clinic information are listed above.  Please call to schedule appointment to follow-up. Recommend continuing Tylenol  ibuprofen  for pain.  You may consider stool softener such as senna or MiraLAX  to improve regular bowel movements.  Also recommend increasing water intake. Thank you for allowing us  to take care of you today.  We hope you begin feeling better soon.   To-Do:  Please follow-up with your primary doctor within the next 2-3 days. Please return to the Emergency Department or call 911 if you experience chest pain, shortness of breath, severe pain, severe fever, altered mental status, or have any reason to think that you need emergency medical care.  Thank you again.  Hope you feel better soon.  Jolynn Pack Department of Emergency Medicine

## 2024-02-04 NOTE — ED Provider Triage Note (Signed)
 Emergency Medicine Provider Triage Evaluation Note  TYREESE THAIN , a 48 y.o. male  was evaluated in triage.  Pt complains of URI symptoms, coughing and wheezing.  Primarily for pain in his pelvis radiating into his testicles left worse than right.  No obvious bulging.  Patient also with recent history of heat exposure at work.  Review of Systems  Positive: Cough and cold Negative: Pelvic pain  Physical Exam  BP 117/80 (BP Location: Right Arm)   Pulse 94   Temp 99.4 F (37.4 C)   Resp 18   SpO2 97%  Gen:   Awake, no distress   Resp:  Normal effort  MSK:   Moves extremities without difficulty  Other:  Normal male anatomy, no obvious hernias normal cremasteric reflex  Medical Decision Making  Medically screening exam initiated at 12:19 PM.  Appropriate orders placed.  DELQUAN POUCHER was informed that the remainder of the evaluation will be completed by another provider, this initial triage assessment does not replace that evaluation, and the importance of remaining in the ED until their evaluation is complete.      Arloa Chroman, PA-C 02/04/24 1223

## 2024-02-09 LAB — GC/CHLAMYDIA PROBE AMP (~~LOC~~) NOT AT ARMC
Chlamydia: NEGATIVE
Comment: NEGATIVE
Comment: NORMAL
Neisseria Gonorrhea: NEGATIVE
